# Patient Record
Sex: Female | Born: 1990 | Race: White | Hispanic: No | Marital: Married | Smoking: Never smoker
Health system: Southern US, Community
[De-identification: ages and names within clinical notes are randomized; demographics above are authoritative.]

## PROBLEM LIST (undated history)

## (undated) ENCOUNTER — Inpatient Hospital Stay (HOSPITAL_COMMUNITY): Payer: Self-pay

## (undated) DIAGNOSIS — F329 Major depressive disorder, single episode, unspecified: Secondary | ICD-10-CM

## (undated) DIAGNOSIS — D580 Hereditary spherocytosis: Secondary | ICD-10-CM

## (undated) DIAGNOSIS — F419 Anxiety disorder, unspecified: Secondary | ICD-10-CM

## (undated) DIAGNOSIS — F32A Depression, unspecified: Secondary | ICD-10-CM

## (undated) DIAGNOSIS — M549 Dorsalgia, unspecified: Secondary | ICD-10-CM

## (undated) DIAGNOSIS — Z87442 Personal history of urinary calculi: Secondary | ICD-10-CM

## (undated) HISTORY — PX: CYST EXCISION: SHX5701

## (undated) HISTORY — PX: WISDOM TOOTH EXTRACTION: SHX21

---
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
## 2018-05-21 ENCOUNTER — Emergency Department (HOSPITAL_BASED_OUTPATIENT_CLINIC_OR_DEPARTMENT_OTHER)

## 2018-05-21 ENCOUNTER — Encounter (HOSPITAL_BASED_OUTPATIENT_CLINIC_OR_DEPARTMENT_OTHER): Payer: Self-pay

## 2018-05-21 ENCOUNTER — Other Ambulatory Visit: Payer: Self-pay

## 2018-05-21 ENCOUNTER — Emergency Department (HOSPITAL_BASED_OUTPATIENT_CLINIC_OR_DEPARTMENT_OTHER)
Admission: EM | Admit: 2018-05-21 | Discharge: 2018-05-21 | Disposition: A | Discharge status: Home / Self Care | Attending: Emergency Medicine | Admitting: Emergency Medicine

## 2018-05-21 DIAGNOSIS — N200 Calculus of kidney: Secondary | ICD-10-CM | POA: Diagnosis not present

## 2018-05-21 DIAGNOSIS — R109 Unspecified abdominal pain: Secondary | ICD-10-CM

## 2018-05-21 DIAGNOSIS — R1012 Left upper quadrant pain: Secondary | ICD-10-CM | POA: Diagnosis present

## 2018-05-21 HISTORY — DX: Hereditary spherocytosis: D58.0

## 2018-05-21 HISTORY — DX: Dorsalgia, unspecified: M54.9

## 2018-05-21 LAB — URINALYSIS, ROUTINE W REFLEX MICROSCOPIC
Bilirubin Urine: NEGATIVE
Glucose, UA: NEGATIVE mg/dL
Ketones, ur: 15 mg/dL — AB
Nitrite: NEGATIVE
PROTEIN: 100 mg/dL — AB
pH: 6.5 (ref 5.0–8.0)

## 2018-05-21 LAB — COMPREHENSIVE METABOLIC PANEL
ALK PHOS: 97 U/L (ref 38–126)
ALT: 14 U/L (ref 0–44)
ANION GAP: 8 (ref 5–15)
AST: 19 U/L (ref 15–41)
Albumin: 4.1 g/dL (ref 3.5–5.0)
BILIRUBIN TOTAL: 0.4 mg/dL (ref 0.3–1.2)
BUN: 12 mg/dL (ref 6–20)
CALCIUM: 8.6 mg/dL — AB (ref 8.9–10.3)
CO2: 25 mmol/L (ref 22–32)
Chloride: 108 mmol/L (ref 98–111)
Creatinine, Ser: 0.72 mg/dL (ref 0.44–1.00)
Glucose, Bld: 96 mg/dL (ref 70–99)
POTASSIUM: 3.8 mmol/L (ref 3.5–5.1)
Sodium: 141 mmol/L (ref 135–145)
TOTAL PROTEIN: 7.6 g/dL (ref 6.5–8.1)

## 2018-05-21 LAB — CBC WITH DIFFERENTIAL/PLATELET
BASOS ABS: 0 10*3/uL (ref 0.0–0.1)
Basophils Relative: 0 %
EOS ABS: 0.2 10*3/uL (ref 0.0–0.7)
Eosinophils Relative: 3 %
HCT: 41.5 % (ref 36.0–46.0)
HEMOGLOBIN: 13.8 g/dL (ref 12.0–15.0)
LYMPHS ABS: 1.5 10*3/uL (ref 0.7–4.0)
LYMPHS PCT: 21 %
MCH: 27.6 pg (ref 26.0–34.0)
MCHC: 33.3 g/dL (ref 30.0–36.0)
MCV: 83 fL (ref 78.0–100.0)
Monocytes Absolute: 0.5 10*3/uL (ref 0.1–1.0)
Monocytes Relative: 7 %
NEUTROS PCT: 69 %
Neutro Abs: 5 10*3/uL (ref 1.7–7.7)
Platelets: 257 10*3/uL (ref 150–400)
RBC: 5 MIL/uL (ref 3.87–5.11)
RDW: 14.1 % (ref 11.5–15.5)
WBC: 7.2 10*3/uL (ref 4.0–10.5)

## 2018-05-21 LAB — URINALYSIS, MICROSCOPIC (REFLEX): RBC / HPF: >50 RBC/hpf (ref 0–5)

## 2018-05-21 LAB — LIPASE, BLOOD: LIPASE: 33 U/L (ref 11–51)

## 2018-05-21 LAB — PREGNANCY, URINE: PREG TEST UR: NEGATIVE

## 2018-05-21 MED ORDER — OXYCODONE-ACETAMINOPHEN 5-325 MG PO TABS: 1.0000 | ORAL_TABLET | ORAL | 0 refills | Status: DC | PRN

## 2018-05-21 MED ORDER — SODIUM CHLORIDE 0.9 % IV BOLUS
1000.0000 mL | Freq: Once | INTRAVENOUS | Status: AC
  Administered 2018-05-21: 1000 mL via INTRAVENOUS

## 2018-05-21 NOTE — ED Triage Notes (Addendum)
C/o left lower back/flank pain x 2-3 days-[facility] and dx with kidney stone and constipation-rx cipro, toradol and flomax-states she is having blood in urine and increase pain and was advised to come to ED-NAD-steady gait

## 2018-05-21 NOTE — Discharge Instructions (Signed)
You have been seen in the Emergency Department (ED) today for pain that we believe based on your workup, is caused by kidney stones.  As we have discussed, please drink plenty of fluids.  Please make a follow up appointment with the physician(s) listed elsewhere in this documentation. ° °You may take pain medication as needed but ONLY as prescribed.  Please also take your prescribed Flomax daily.  We also recommend that you take over-the-counter ibuprofen regularly according to label instructions over the next 5 days.  Take it with meals to minimize stomach discomfort. ° °Please see your doctor as soon as possible as stones may take 1-3 weeks to pass and you may require additional care or medications. ° °Do not drink alcohol, drive or participate in any other potentially dangerous activities while taking opiate pain medication as it may make you sleepy. Do not take this medication with any other sedating medications, either prescription or over-the-counter. If you were prescribed Percocet or Vicodin, do not take these with acetaminophen (Tylenol) as it is already contained within these medications. °  °Take Percocet as needed for severe pain.  This medication is an opiate (or narcotic) pain medication and can be habit forming.  Use it as little as possible to achieve adequate pain control.  Do not use or use it with extreme caution if you have a history of opiate abuse or dependence.  If you are on a pain contract with your primary care doctor or a pain specialist, be sure to let them know you were prescribed this medication today from the Emergency Department.  This medication is intended for your use only - do not give any to anyone else and keep it in a secure place where nobody else, especially children, have access to it.  It will also cause or worsen constipation, so you may want to consider taking an over-the-counter stool softener while you are taking this medication. ° °Return to the Emergency Department  (ED) or call your doctor if you have any worsening pain, fever, painful urination, are unable to urinate, or develop other symptoms that concern you. ° ° ° °Kidney Stones °Kidney stones (urolithiasis) are deposits that form inside your kidneys. The intense pain is caused by the stone moving through the urinary tract. When the stone moves, the ureter goes into spasm around the stone. The stone is usually passed in the urine.  °CAUSES  °A disorder that makes certain neck glands produce too much parathyroid hormone (primary hyperparathyroidism). °A buildup of uric acid crystals, similar to gout in your joints. °Narrowing (stricture) of the ureter. °A kidney obstruction present at birth (congenital obstruction). °Previous surgery on the kidney or ureters. °Numerous kidney infections. °SYMPTOMS  °Feeling sick to your stomach (nauseous). °Throwing up (vomiting). °Blood in the urine (hematuria). °Pain that usually spreads (radiates) to the groin. °Frequency or urgency of urination. °DIAGNOSIS  °Taking a history and physical exam. °Blood or urine tests. °CT scan. °Occasionally, an examination of the inside of the urinary bladder (cystoscopy) is performed. °TREATMENT  °Observation. °Increasing your fluid intake. °Extracorporeal shock wave lithotripsy--This is a noninvasive procedure that uses shock waves to break up kidney stones. °Surgery may be needed if you have severe pain or persistent obstruction. There are various surgical procedures. Most of the procedures are performed with the use of small instruments. Only small incisions are needed to accommodate these instruments, so recovery time is minimized. °The size, location, and chemical composition are all important variables that will determine the proper   choice of action for you. Talk to your health care provider to better understand your situation so that you will minimize the risk of injury to yourself and your kidney.  °HOME CARE INSTRUCTIONS  °Drink enough water  and fluids to keep your urine clear or pale yellow. This will help you to pass the stone or stone fragments. °Strain all urine through the provided strainer. Keep all particulate matter and stones for your health care provider to see. The stone causing the pain may be as small as a grain of salt. It is very important to use the strainer each and every time you pass your urine. The collection of your stone will allow your health care provider to analyze it and verify that a stone has actually passed. The stone analysis will often identify what you can do to reduce the incidence of recurrences. °Only take over-the-counter or prescription medicines for pain, discomfort, or fever as directed by your health care provider. °Keep all follow-up visits as told by your health care provider. This is important. °Get follow-up X-rays if required. The absence of pain does not always mean that the stone has passed. It may have only stopped moving. If the urine remains completely obstructed, it can cause loss of kidney function or even complete destruction of the kidney. It is your responsibility to make sure X-rays and follow-ups are completed. Ultrasounds of the kidney can show blockages and the status of the kidney. Ultrasounds are not associated with any radiation and can be performed easily in a matter of minutes. °Make changes to your daily diet as told by your health care provider. You may be told to: °Limit the amount of salt that you eat. °Eat 5 or more servings of fruits and vegetables each day. °Limit the amount of meat, poultry, fish, and eggs that you eat. °Collect a 24-hour urine sample as told by your health care provider. You may need to collect another urine sample every 6-12 months. °SEEK MEDICAL CARE IF: °You experience pain that is progressive and unresponsive to any pain medicine you have been prescribed. °SEEK IMMEDIATE MEDICAL CARE IF:  °Pain cannot be controlled with the prescribed medicine. °You have a  fever or shaking chills. °The severity or intensity of pain increases over 18 hours and is not relieved by pain medicine. °You develop a new onset of abdominal pain. °You feel faint or pass out. °You are unable to urinate. °  °This information is not intended to replace advice given to you by your health care provider. Make sure you discuss any questions you have with your health care provider. °  °Document Released: 08/25/2005 Document Revised: 05/16/2015 Document Reviewed: 01/26/2013 °Elsevier Interactive Patient Education ©2016 [employer] ° ° °

## 2018-05-21 NOTE — ED Provider Notes (Signed)
Emergency Department Provider Note   I have reviewed the triage vital signs and the nursing notes.   HISTORY  Chief Complaint Hematuria   HPI Maria Mercado is a 27 y.o. female resents to the emergency department for evaluation of left flank pain and hematuria.  The patient went to urgent care earlier today where a urinalysis was performed along with abdominal x-ray.  The patient states that she was told she had kidney stones, severe constipation, and possible urine infection.  They called in a prescription for Cipro which the patient has picked up but not taken yet.  They had to call the urgent care back about another prescription that was not at the pharmacy.  She told them at that time she has been having worsening left-sided pain and now more lower abdominal discomfort so was referred to the emergency department.  She denies any fevers or chills.  No vaginal bleeding or discharge.  She has had approximately 5 small, hard bowel movements today after starting MiraLAX.   Past Medical History:  Diagnosis Date  . Back pain   . HS (hereditary spherocytosis) (HCC)     There are no active problems to display for this patient.   Past Surgical History:  Procedure Laterality Date  . WISDOM TOOTH EXTRACTION      Allergies Patient has no known allergies.  No family history on file.  Social History Social History   Tobacco Use  . Smoking status: Never Smoker  . Smokeless tobacco: Never Used  Substance Use Topics  . Alcohol use: Yes    Comment: occ  . Drug use: Never    Review of Systems  Constitutional: No fever/chills Eyes: No visual changes. ENT: No sore throat. Cardiovascular: Denies chest pain. Respiratory: Denies shortness of breath. Gastrointestinal: Positive lower abdominal pain. Positive nausea, no vomiting.  No diarrhea.  Positive constipation. Genitourinary: Positive left flank pain and hematuria.  Musculoskeletal: Negative for back pain. Skin: Negative  for rash. Neurological: Negative for headaches, focal weakness or numbness.  10-point ROS otherwise negative.  ____________________________________________   PHYSICAL EXAM:  VITAL SIGNS: ED Triage Vitals  Enc Vitals Group     BP 05/21/18 1836 139/86     Pulse Rate 05/21/18 1836 67     Resp 05/21/18 1836 18     Temp 05/21/18 1836 98.5 F (36.9 C)     Temp Source 05/21/18 1836 Oral     SpO2 05/21/18 1836 100 %     Weight 05/21/18 1837 177 lb (80.3 kg)     Height 05/21/18 1837 5\' 5"  (1.651 m)     Pain Score 05/21/18 1837 7   Constitutional: Alert and oriented. Well appearing and in no acute distress. Eyes: Conjunctivae are normal.  Head: Atraumatic. Nose: No congestion/rhinnorhea. Mouth/Throat: Mucous membranes are moist. Neck: No stridor. Cardiovascular: Normal rate, regular rhythm. Good peripheral circulation. Grossly normal heart sounds.   Respiratory: Normal respiratory effort.  No retractions. Lungs CTAB. Gastrointestinal: Soft with mild left sided abdominal pain . No distention.  Musculoskeletal: No lower extremity tenderness nor edema. No gross deformities of extremities. Neurologic:  Normal speech and language. No gross focal neurologic deficits are appreciated.  Skin:  Skin is warm, dry and intact. No rash noted.  ____________________________________________   LABS (all labs ordered are listed, but only abnormal results are displayed)  Labs Reviewed  URINALYSIS, ROUTINE W REFLEX MICROSCOPIC - Abnormal; Notable for the following components:      Result Value   Color, Urine BROWN (*)  APPearance CLOUDY (*)    Specific Gravity, Urine >1.030 (*)    Hgb urine dipstick LARGE (*)    Ketones, ur 15 (*)    Protein, ur 100 (*)    Leukocytes, UA TRACE (*)    All other components within normal limits  URINALYSIS, MICROSCOPIC (REFLEX) - Abnormal; Notable for the following components:   Bacteria, UA MANY (*)    All other components within normal limits  COMPREHENSIVE  METABOLIC PANEL - Abnormal; Notable for the following components:   Calcium 8.6 (*)    All other components within normal limits  URINE CULTURE  CBC WITH DIFFERENTIAL/PLATELET  PREGNANCY, URINE  LIPASE, BLOOD   ____________________________________________  RADIOLOGY  Ct Renal Stone Study  Result Date: 05/21/2018 CLINICAL DATA:  Left flank pain EXAM: CT ABDOMEN AND PELVIS WITHOUT CONTRAST TECHNIQUE: Multidetector CT imaging of the abdomen and pelvis was performed following the standard protocol without IV contrast. COMPARISON:  None. FINDINGS: Lower chest: No acute abnormality. Hepatobiliary: No focal liver abnormality is seen. No gallstones, gallbladder wall thickening, or biliary dilatation. Pancreas: Unremarkable. No pancreatic ductal dilatation or surrounding inflammatory changes. Spleen: Normal in size without focal abnormality. Adrenals/Urinary Tract: Adrenal glands are within normal limits. Punctate nonobstructing stone lower pole right kidney. Multiple small stones in the mid left kidney measuring up to 2 mm. Mild left hydronephrosis and hydroureter, secondary to a 2 mm stone in the distal left ureter about a cm proximal to the left UVJ Stomach/Bowel: Stomach is within normal limits. Appendix appears normal. No evidence of bowel wall thickening, distention, or inflammatory changes. Vascular/Lymphatic: No significant vascular findings are present. No enlarged abdominal or pelvic lymph nodes. Reproductive: Uterus and bilateral adnexa are unremarkable. Other: No abdominal wall hernia or abnormality. No abdominopelvic ascites. Musculoskeletal: No acute or significant osseous findings. IMPRESSION: 1. Mild left hydronephrosis and hydroureter, secondary to a 2 mm stone in the distal left ureter about a cm proximal to the left UVJ. 2. Bilateral intrarenal calculi. Electronically Signed   By: Jasmine PangKim  Fujinaga M.D.   On: 05/21/2018 20:24     ____________________________________________   PROCEDURES  Procedure(s) performed:   Procedures  None ____________________________________________   INITIAL IMPRESSION / ASSESSMENT AND PLAN / ED COURSE  Pertinent labs & imaging results that were available during my care of the patient were reviewed by me and considered in my medical decision making (see chart for details).  Presents to the emergency department with left flank pain, hematuria.  Pain is now worsened and seems more lower down to the patient.  She had a plain film at urgent care which showed constipation and according to the patient kidney stones.  Plan for CT renal, labs, UA, and IVF.   CT with small left UJV stone. Patient with Flomax and Toradol prescribed from UC. Advised not taking the Cipro with no clear infection on UA. Patient to continue meds for constipation. Provided stronger pain meds for breakthrough pain but discussed constipation side effects if taken so will try to avoid.   At this time, I do not feel there is any life-threatening condition present. I have reviewed and discussed all results (EKG, imaging, lab, urine as appropriate), exam findings with patient. I have reviewed nursing notes and appropriate previous records.  I feel the patient is safe to be discharged home without further emergent workup. Discussed usual and customary return precautions. Patient and family (if present) verbalize understanding and are comfortable with this plan.  Patient will follow-up with their primary care provider. If  they do not have a primary care provider, information for follow-up has been provided to them. All questions have been answered.  ____________________________________________  FINAL CLINICAL IMPRESSION(S) / ED DIAGNOSES  Final diagnoses:  Kidney stone  Left flank pain     MEDICATIONS GIVEN DURING THIS VISIT:  Medications  sodium chloride 0.9 % bolus 1,000 mL ( Intravenous Stopped 05/21/18 2030)      NEW OUTPATIENT MEDICATIONS STARTED DURING THIS VISIT:  Discharge Medication List as of 05/21/2018  9:00 PM    START taking these medications   Details  oxyCODONE-acetaminophen (PERCOCET/ROXICET) 5-325 MG tablet Take 1 tablet by mouth every 4 (four) hours as needed for severe pain., Starting Fri 05/21/2018, Print        Note:  This document was prepared using Dragon voice recognition software and may include unintentional dictation errors.  Alona Bene, MD Emergency Medicine    Laycee Fitzsimmons, Arlyss Repress, MD 05/22/18 1009

## 2018-05-21 NOTE — ED Notes (Signed)
wtg on u-preg to proceed w/imaging 

## 2018-05-23 LAB — URINE CULTURE: SPECIAL REQUESTS: NORMAL

## 2018-06-01 DIAGNOSIS — L732 Hidradenitis suppurativa: Secondary | ICD-10-CM | POA: Insufficient documentation

## 2018-06-30 ENCOUNTER — Other Ambulatory Visit: Payer: Self-pay

## 2018-06-30 ENCOUNTER — Emergency Department (HOSPITAL_COMMUNITY)
Admission: EM | Admit: 2018-06-30 | Discharge: 2018-07-01 | Disposition: A | Discharge status: Home / Self Care | Attending: Emergency Medicine | Admitting: Emergency Medicine

## 2018-06-30 ENCOUNTER — Encounter (HOSPITAL_COMMUNITY): Payer: Self-pay | Admitting: Emergency Medicine

## 2018-06-30 DIAGNOSIS — S4992XA Unspecified injury of left shoulder and upper arm, initial encounter: Secondary | ICD-10-CM | POA: Diagnosis present

## 2018-06-30 DIAGNOSIS — M7918 Myalgia, other site: Secondary | ICD-10-CM

## 2018-06-30 DIAGNOSIS — Y92410 Unspecified street and highway as the place of occurrence of the external cause: Secondary | ICD-10-CM | POA: Diagnosis not present

## 2018-06-30 DIAGNOSIS — Y999 Unspecified external cause status: Secondary | ICD-10-CM | POA: Diagnosis not present

## 2018-06-30 DIAGNOSIS — Y939 Activity, unspecified: Secondary | ICD-10-CM | POA: Insufficient documentation
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
## 2018-07-01 ENCOUNTER — Emergency Department (HOSPITAL_COMMUNITY)

## 2018-07-01 MED ORDER — IBUPROFEN 600 MG PO TABS: 600.0000 mg | ORAL_TABLET | Freq: Four times a day (QID) | ORAL | 0 refills | Status: DC | PRN

## 2018-07-01 MED ORDER — CYCLOBENZAPRINE HCL 10 MG PO TABS: 10.0000 mg | ORAL_TABLET | Freq: Two times a day (BID) | ORAL | 0 refills | Status: DC | PRN
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
## 2018-08-03 ENCOUNTER — Emergency Department (HOSPITAL_COMMUNITY)

## 2018-08-03 ENCOUNTER — Emergency Department (HOSPITAL_COMMUNITY)
Admission: EM | Admit: 2018-08-03 | Discharge: 2018-08-04 | Disposition: A | Discharge status: Home / Self Care | Attending: Emergency Medicine | Admitting: Emergency Medicine

## 2018-08-03 ENCOUNTER — Encounter: Payer: Self-pay | Admitting: Emergency Medicine

## 2018-08-03 DIAGNOSIS — O219 Vomiting of pregnancy, unspecified: Secondary | ICD-10-CM | POA: Diagnosis not present

## 2018-08-03 DIAGNOSIS — Z79899 Other long term (current) drug therapy: Secondary | ICD-10-CM | POA: Diagnosis not present

## 2018-08-03 DIAGNOSIS — R102 Pelvic and perineal pain: Secondary | ICD-10-CM | POA: Insufficient documentation

## 2018-08-03 DIAGNOSIS — Z3A01 Less than 8 weeks gestation of pregnancy: Secondary | ICD-10-CM | POA: Diagnosis not present

## 2018-08-03 LAB — COMPREHENSIVE METABOLIC PANEL
ALT: 21 U/L (ref 0–44)
AST: 19 U/L (ref 15–41)
Albumin: 3.8 g/dL (ref 3.5–5.0)
Alkaline Phosphatase: 101 U/L (ref 38–126)
Anion gap: 10 (ref 5–15)
BUN: 6 mg/dL (ref 6–20)
CO2: 23 mmol/L (ref 22–32)
Calcium: 9.4 mg/dL (ref 8.9–10.3)
Chloride: 105 mmol/L (ref 98–111)
Creatinine, Ser: 0.67 mg/dL (ref 0.44–1.00)
GFR calc Af Amer: >60 mL/min (ref >60)
GFR calc non Af Amer: >60 mL/min (ref >60)
Glucose, Bld: 88 mg/dL (ref 70–99)
Potassium: 3.8 mmol/L (ref 3.5–5.1)
Sodium: 138 mmol/L (ref 135–145)
Total Bilirubin: 0.8 mg/dL (ref 0.3–1.2)
Total Protein: 7.8 g/dL (ref 6.5–8.1)

## 2018-08-03 LAB — CBC
HCT: 41.6 % (ref 36.0–46.0)
Hemoglobin: 12.9 g/dL (ref 12.0–15.0)
MCH: 26.4 pg (ref 26.0–34.0)
MCHC: 31 g/dL (ref 30.0–36.0)
MCV: 85.1 fL (ref 80.0–100.0)
Platelets: 286 10*3/uL (ref 150–400)
RBC: 4.89 MIL/uL (ref 3.87–5.11)
RDW: 12.8 % (ref 11.5–15.5)
WBC: 10.7 10*3/uL — ABNORMAL HIGH (ref 4.0–10.5)
nRBC: 0 % (ref 0.0–0.2)

## 2018-08-03 LAB — URINALYSIS, ROUTINE W REFLEX MICROSCOPIC
Bacteria, UA: NONE SEEN
Bilirubin Urine: NEGATIVE
Glucose, UA: NEGATIVE mg/dL
Hgb urine dipstick: NEGATIVE
Ketones, ur: 20 mg/dL — AB
Nitrite: NEGATIVE
Protein, ur: 30 mg/dL — AB
Specific Gravity, Urine: 1.028 (ref 1.005–1.030)
pH: 5 (ref 5.0–8.0)

## 2018-08-03 LAB — WET PREP, GENITAL
Clue Cells Wet Prep HPF POC: NONE SEEN
SPERM: NONE SEEN
Trich, Wet Prep: NONE SEEN
Yeast Wet Prep HPF POC: NONE SEEN

## 2018-08-03 LAB — LIPASE, BLOOD: Lipase: 28 U/L (ref 11–51)

## 2018-08-03 LAB — HCG, QUANTITATIVE, PREGNANCY: hCG, Beta Chain, Quant, S: 63662 m[IU]/mL — ABNORMAL HIGH (ref <5)

## 2018-08-03 MED ORDER — ONDANSETRON HCL 4 MG/2ML IJ SOLN
4.0000 mg | Freq: Once | INTRAMUSCULAR | Status: AC
  Administered 2018-08-03: 4 mg via INTRAVENOUS
  Filled 2018-08-03: qty 2

## 2018-08-03 MED ORDER — SODIUM CHLORIDE 0.9 % IV BOLUS
500.0000 mL | Freq: Once | INTRAVENOUS | Status: AC
  Administered 2018-08-03: 500 mL via INTRAVENOUS

## 2018-08-03 NOTE — ED Notes (Signed)
Patient given Ginger Ale and Water. Tolerating PO fluids.

## 2018-08-03 NOTE — ED Triage Notes (Addendum)
Reports having nausea and vomiting that started last night.  Patient is [redacted] weeks pregnant.  Taking phenergan without success.  Has been on that for a week for increased nausea.  Also reports having LL quad cramping intermittently.

## 2018-08-03 NOTE — ED Notes (Signed)
Patient transported to Ultrasound 

## 2018-08-03 NOTE — ED Provider Notes (Signed)
[facility] EMERGENCY DEPARTMENT Provider Note   CSN: [id number] Arrival date & time: 08/03/18  1927     History   Chief Complaint Chief Complaint  Patient presents with  . Emesis During Pregnancy    HPI Maria Mercado is a 27 y.o. female.  HPI   Pt is a 27 y/o female G3P1A1 that is currently [redacted] weeks pregnant by LMP who presents to the ED today c/o nausea and vomiting that began last night. States that she has had nausea for the entire pregnancy but has not vomited until yesterday. States she vomited x2 yesterday and x10. Denies diarrhea, urinary sxs. Denies fevers, chills. She has mild cramping on the left side of the abdomen that began this afternoon. Cramping is there constantly, she rates pain 2-3/10.   States she has been constipated. Denies abnormal vaginal discharge or vaginal bleeding.   LMP 06/12/18. Pt has an appt with [facility] 08/09/18. Has not had [place]. Has been taking prenatal vitamins.  Past Medical History:  Diagnosis Date  . Back pain   . HS (hereditary spherocytosis) (HCC)     There are no active problems to display for this patient.   Past Surgical History:  Procedure Laterality Date  . WISDOM TOOTH EXTRACTION       OB History    Gravida  1   Para      Term      Preterm      AB      Living        SAB      TAB      Ectopic      Multiple      Live Births               Home Medications    Prior to Admission medications   Medication Sig Start Date End Date Taking? Authorizing Provider  FLUoxetine (PROZAC) 20 MG tablet Take 40 mg by mouth daily.   Yes [provider]  Prenatal Vit-Fe Fumarate-FA (PRENATAL MULTIVITAMIN) TABS tablet Take 2 tablets by mouth daily at 12 noon.   Yes [provider]  promethazine (PHENERGAN) 25 MG tablet Take 25 mg by mouth every 6 (six) hours as needed for nausea or vomiting.   Yes [provider]  cyclobenzaprine (FLEXERIL) 10 MG tablet Take 1 tablet  (10 mg total) by mouth 2 (two) times daily as needed for muscle spasms. Patient not taking: Reported on 08/03/2018 07/01/18   Elpidio Anis, PA-C  ibuprofen (ADVIL,MOTRIN) 600 MG tablet Take 1 tablet (600 mg total) by mouth every 6 (six) hours as needed. Patient not taking: Reported on 08/03/2018 07/01/18   Elpidio Anis, PA-C  ondansetron (ZOFRAN) 4 MG tablet Take 1 tablet (4 mg total) by mouth every 8 (eight) hours as needed for nausea or vomiting. 08/04/18   Rosebud Koenen S, PA-C  oxyCODONE-acetaminophen (PERCOCET/ROXICET) 5-325 MG tablet Take 1 tablet by mouth every 4 (four) hours as needed for severe pain. Patient not taking: Reported on 08/03/2018 05/21/18   Long, Arlyss Repress, MD    Family History No family history on file.  Social History Social History   Tobacco Use  . Smoking status: Never Smoker  . Smokeless tobacco: Never Used  Substance Use Topics  . Alcohol use: Yes    Comment: occ  . Drug use: Never     Allergies   Patient has no known allergies.   Review of Systems Review of Systems  Constitutional: Negative for fever.  HENT: Negative for ear pain and sore throat.   Eyes: Negative for pain and visual disturbance.  Respiratory: Negative for cough and shortness of breath.   Cardiovascular: Negative for chest pain.  Gastrointestinal: Positive for abdominal pain, constipation, nausea and vomiting. Negative for diarrhea.  Genitourinary: Negative for dysuria and hematuria.  Musculoskeletal: Negative for back pain.  Skin: Negative for color change and rash.  Neurological: Negative for headaches.  All other systems reviewed and are negative.    Physical Exam Updated Vital Signs BP 105/69   Pulse 89   Temp 98.3 F (36.8 C) (Oral)   Resp 16   Ht 5\' 5"  (1.651 m)   Wt 80.7 kg   LMP 06/12/2018   SpO2 99%   BMI 29.62 kg/m   Physical Exam  Constitutional: She appears well-developed and well-nourished. No distress.  HENT:  Head: Normocephalic and  atraumatic.  Eyes: Conjunctivae are normal.  Neck: Neck supple.  Cardiovascular: Normal rate and regular rhythm.  No murmur heard. Pulmonary/Chest: Effort normal and breath sounds normal. No respiratory distress.  Abdominal: Soft. Bowel sounds are normal.  Very mild RLQ TTP, no rebound, guarding or rigidity.  Genitourinary:  Genitourinary Comments: Exam performed by Karrie Meres,  exam chaperoned Date: 08/04/2018 Pelvic exam: normal external genitalia without evidence of trauma. VULVA: normal appearing vulva with no masses, tenderness or lesion. VAGINA: normal appearing vagina with normal color and discharge, no lesions. CERVIX: normal appearing cervix without lesions, cervical motion tenderness absent, cervical os closed with out purulent discharge; vaginal discharge noted (white) and DNA probe for chlamydia and GC obtained.   ADNEXA: normal adnexa in size, nontender and no masses UTERUS: mild uterine tenderness  Musculoskeletal: She exhibits no edema.  Neurological: She is alert.  Skin: Skin is warm and dry.  Psychiatric: She has a normal mood and affect.  Nursing note and vitals reviewed.   ED Treatments / Results  Labs (all labs ordered are listed, but only abnormal results are displayed) Labs Reviewed  WET PREP, GENITAL - Abnormal; Notable for the following components:      Result Value   WBC, Wet Prep HPF POC MANY (*)    All other components within normal limits  CBC - Abnormal; Notable for the following components:   WBC 10.7 (*)    All other components within normal limits  URINALYSIS, ROUTINE W REFLEX MICROSCOPIC - Abnormal; Notable for the following components:   Color, Urine AMBER (*)    APPearance CLOUDY (*)    Ketones, ur 20 (*)    Protein, ur 30 (*)    Leukocytes, UA SMALL (*)    All other components within normal limits  HCG, QUANTITATIVE, PREGNANCY - Abnormal; Notable for the following components:   hCG, Beta Chain, Quant, [place] 40,981 (*)    All other  components within normal limits  URINE CULTURE  LIPASE, BLOOD  COMPREHENSIVE METABOLIC PANEL  GC/CHLAMYDIA PROBE AMP ([place]) NOT AT [facility]    EKG None  Radiology No results found.  Procedures Procedures (including critical care time)  Medications Ordered in ED Medications  sodium chloride 0.9 % bolus 500 mL (0 mLs Intravenous Stopped 08/03/18 2330)  ondansetron (ZOFRAN) injection 4 mg (4 mg Intravenous Given 08/03/18 2257)     Initial Impression / Assessment and Plan / ED Course  I have reviewed the triage vital signs and the nursing notes.  Pertinent labs & imaging results that were available during my care of the patient were reviewed by me and  considered in my medical decision making (see chart for details).     Final Clinical Impressions(s) / ED Diagnoses   Final diagnoses:  Nausea and vomiting in pregnancy   Patient presenting with nausea, vomiting and abdominal cramping in setting of pregnancy.  States she is [redacted] weeks pregnant by last menstrual cycle.  She has not had an ultrasound to confirm this though she does have OB/GYN follow-up scheduled next week.  Has been taking Phenergan at home with no significant relief.  On exam patient has some mild abdominal tenderness to the right lower quadrant without rebound, rigidity or guarding.  No grimacing on exam.  Pelvic exam with mild uterine tenderness, no adnexal tenderness.  There is some white discharge in the vaginal vault.  No cervical motion tenderness.  Patient has a mild leukocytosis at 10.7, CMP is within normal limits.  Lipase is negative.  Beta hCG positive at 63,000.  UA with ketonuria, proteinuria and small amount of leukocytes.  No significant red blood cells, white blood cells or bacteria.  There are 21-50 squamous epithelial cells, therefore suspect that the specimen is contaminated.  Not suspicious for urinary tract infection and patient has no symptoms.  Will send for culture.  Wet prep with white blood  cells, no trichomoniasis or yeast.  No clue cells.  GC chlamydia obtained.  Patient given a dose of Zofran in the ED as well as a small bolus of fluids.  We will also plan for pelvic ultrasound to rule out ectopic.  At time of shift change, patient care was signed out to Sharilyn SitesLisa Sanders, PA-C with plan to follow-up on the results of the ultrasound and make sure the patient can pass p.o. challenge.  If results are reassuring and patient able to tolerate p.o., will plan for discharge with Rx for Zofran and close follow-up with her OB/GYN.  I discussed strict return precautions with her for any new or worsening symptoms or any worsening abdominal pain.  She voices an understanding of the plan reasons to return.  ED Discharge Orders         Ordered    ondansetron (ZOFRAN) 4 MG tablet  Every 8 hours PRN     08/04/18 0023           Karrie MeresCouture, Guss Farruggia S, PA-C 08/04/18 0028    Raeford RazorKohut, Stephen, MD 08/11/18 1709

## 2018-08-04 LAB — GC/CHLAMYDIA PROBE AMP (~~LOC~~) NOT AT ARMC
CHLAMYDIA, DNA PROBE: NEGATIVE
Neisseria Gonorrhea: NEGATIVE

## 2018-08-04 MED ORDER — ONDANSETRON HCL 4 MG PO TABS: 4.0000 mg | ORAL_TABLET | Freq: Three times a day (TID) | ORAL | 0 refills | Status: DC | PRN

## 2018-08-04 NOTE — ED Notes (Signed)
Patient verbalizes understanding of discharge instructions. Opportunity for questioning and answers were provided. Armband removed by staff, pt discharged from ED ambulatory.   

## 2018-08-04 NOTE — ED Provider Notes (Signed)
Results for orders placed or performed during the hospital encounter of 08/03/18  Wet prep, genital  Result Value Ref Range   Yeast Wet Prep HPF POC NONE SEEN NONE SEEN   Trich, Wet Prep NONE SEEN NONE SEEN   Clue Cells Wet Prep HPF POC NONE SEEN NONE SEEN   WBC, Wet Prep HPF POC MANY (A) NONE SEEN   Sperm NONE SEEN   Lipase, blood  Result Value Ref Range   Lipase 28 11 - 51 U/L  Comprehensive metabolic panel  Result Value Ref Range   Sodium 138 135 - 145 mmol/L   Potassium 3.8 3.5 - 5.1 mmol/L   Chloride 105 98 - 111 mmol/L   CO2 23 22 - 32 mmol/L   Glucose, Bld 88 70 - 99 mg/dL   BUN 6 6 - 20 mg/dL   Creatinine, Ser 4.09 0.44 - 1.00 mg/dL   Calcium 9.4 8.9 - 81.1 mg/dL   Total Protein 7.8 6.5 - 8.1 g/dL   Albumin 3.8 3.5 - 5.0 g/dL   AST 19 15 - 41 U/L   ALT 21 0 - 44 U/L   Alkaline Phosphatase 101 38 - 126 U/L   Total Bilirubin 0.8 0.3 - 1.2 mg/dL   GFR calc non Af Amer >60 >60 mL/min   GFR calc Af Amer >60 >60 mL/min   Anion gap 10 5 - 15  CBC  Result Value Ref Range   WBC 10.7 (H) 4.0 - 10.5 K/uL   RBC 4.89 3.87 - 5.11 MIL/uL   Hemoglobin 12.9 12.0 - 15.0 g/dL   HCT 91.4 78.2 - 95.6 %   MCV 85.1 80.0 - 100.0 fL   MCH 26.4 26.0 - 34.0 pg   MCHC 31.0 30.0 - 36.0 g/dL   RDW 21.3 08.6 - 57.8 %   Platelets 286 150 - 400 K/uL   nRBC 0.0 0.0 - 0.2 %  Urinalysis, Routine w reflex microscopic  Result Value Ref Range   Color, Urine AMBER (A) YELLOW   APPearance CLOUDY (A) CLEAR   Specific Gravity, Urine 1.028 1.005 - 1.030   pH 5.0 5.0 - 8.0   Glucose, UA NEGATIVE NEGATIVE mg/dL   Hgb urine dipstick NEGATIVE NEGATIVE   Bilirubin Urine NEGATIVE NEGATIVE   Ketones, ur 20 (A) NEGATIVE mg/dL   Protein, ur 30 (A) NEGATIVE mg/dL   Nitrite NEGATIVE NEGATIVE   Leukocytes, UA SMALL (A) NEGATIVE   RBC / HPF 0-5 0 - 5 RBC/hpf   WBC, UA 0-5 0 - 5 WBC/hpf   Bacteria, UA NONE SEEN NONE SEEN   Squamous Epithelial / LPF 21-50 0 - 5   Mucus PRESENT   hCG, quantitative,  pregnancy  Result Value Ref Range   hCG, Beta Chain, Quant, S 63,662 (H) <5 mIU/mL   Maria Ob Comp < 14 Wks  Result Date: 08/04/2018 CLINICAL DATA:  Pelvic pain and cramping. Gestational age by LMP of 7 weeks 3 days. EXAM: OBSTETRIC <14 WK Maria AND TRANSVAGINAL OB Maria TECHNIQUE: Both transabdominal and transvaginal ultrasound examinations were performed for complete evaluation of the gestation as well as the maternal uterus, adnexal regions, and pelvic cul-de-sac. Transvaginal technique was performed to assess early pregnancy. COMPARISON:  None. FINDINGS: Intrauterine gestational sac: Single Yolk sac:  Visualized. Embryo:  Visualized. Cardiac Activity: Visualized. Heart Rate: 131 bpm CRL:  8 mm   6 w   5 d  US EDC: 06/17/2018 Subchorionic hemorrhage:  None visualized. Maternal uterus/adnexae: Normal appearance of both ovaries. No mass or abnormal free fluid identified. IMPRESSION: Single living IUP measuring 6 weeks 5 days, with US EDC of 06/17/2018. No significant maternal uterine or adnexal abnormality identified. Electronically Signed   By: Myles RosenthalJohn  Mercado M.D.   On: 08/04/2018 00:31     1:19 AM Tolerating PO without issue.  US without acute findings.  D/c home with instructions and prescribed as outlined by PA Couture.   Garlon HatchetSanders, Harolyn Cocker M, PA-C 08/04/18 [id number]    Dione BoozeGlick, David, MD 08/04/18 [id number]

## 2018-08-04 NOTE — Discharge Instructions (Signed)
Please take zofran as directed on discharge paperwork.  Please follow up with your primary doctor/ob-gyn within the next 5-7 days.  If you do not have a primary care provider, information for a healthcare clinic has been provided for you to make arrangements for follow up care. Please return to the ER sooner if you have any new or worsening symptoms, or if you have any of the following symptoms:  Abdominal pain that does not go away.  You have a fever.  You keep throwing up (vomiting).  The pain is felt only in portions of the abdomen. Pain in the right side could possibly be appendicitis. In an adult, pain in the left lower portion of the abdomen could be colitis or diverticulitis.  You pass bloody or black tarry stools.  There is bright red blood in the stool.  The constipation stays for more than 4 days.  There is belly (abdominal) or rectal pain.  You do not seem to be getting better.  You have any questions or concerns.

## 2018-08-05 LAB — URINE CULTURE

## 2018-08-16 ENCOUNTER — Encounter (HOSPITAL_COMMUNITY): Payer: Self-pay

## 2018-08-16 ENCOUNTER — Inpatient Hospital Stay (HOSPITAL_COMMUNITY): Payer: No Typology Code available for payment source

## 2018-08-16 ENCOUNTER — Inpatient Hospital Stay (HOSPITAL_COMMUNITY)
Admission: AD | Admit: 2018-08-16 | Discharge: 2018-08-16 | Disposition: A | Discharge status: Home / Self Care | Payer: No Typology Code available for payment source | Source: Ambulatory Visit | Attending: Obstetrics and Gynecology | Admitting: Obstetrics and Gynecology

## 2018-08-16 DIAGNOSIS — O26831 Pregnancy related renal disease, first trimester: Secondary | ICD-10-CM | POA: Diagnosis not present

## 2018-08-16 DIAGNOSIS — Z3A09 9 weeks gestation of pregnancy: Secondary | ICD-10-CM | POA: Insufficient documentation

## 2018-08-16 DIAGNOSIS — N132 Hydronephrosis with renal and ureteral calculous obstruction: Secondary | ICD-10-CM | POA: Insufficient documentation

## 2018-08-16 DIAGNOSIS — R109 Unspecified abdominal pain: Secondary | ICD-10-CM | POA: Diagnosis not present

## 2018-08-16 DIAGNOSIS — Z87442 Personal history of urinary calculi: Secondary | ICD-10-CM

## 2018-08-16 DIAGNOSIS — O26891 Other specified pregnancy related conditions, first trimester: Secondary | ICD-10-CM | POA: Diagnosis not present

## 2018-08-16 HISTORY — DX: Personal history of urinary calculi: Z87.442

## 2018-08-16 LAB — URINALYSIS, ROUTINE W REFLEX MICROSCOPIC
Bilirubin Urine: NEGATIVE
Glucose, UA: NEGATIVE mg/dL
KETONES UR: 80 mg/dL — AB
Nitrite: NEGATIVE
PROTEIN: 100 mg/dL — AB
Specific Gravity, Urine: 1.026 (ref 1.005–1.030)
Squamous Epithelial / LPF: >50 — ABNORMAL HIGH (ref 0–5)
pH: 5 (ref 5.0–8.0)

## 2018-08-16 LAB — COMPREHENSIVE METABOLIC PANEL
ALBUMIN: 4.2 g/dL (ref 3.5–5.0)
ALK PHOS: 112 U/L (ref 38–126)
ALT: 42 U/L (ref 0–44)
AST: 28 U/L (ref 15–41)
Anion gap: 12 (ref 5–15)
BUN: 11 mg/dL (ref 6–20)
CALCIUM: 9.5 mg/dL (ref 8.9–10.3)
CHLORIDE: 104 mmol/L (ref 98–111)
CO2: 23 mmol/L (ref 22–32)
CREATININE: 0.7 mg/dL (ref 0.44–1.00)
GFR calc Af Amer: >60 mL/min (ref >60)
GFR calc non Af Amer: >60 mL/min (ref >60)
GLUCOSE: 141 mg/dL — AB (ref 70–99)
Potassium: 4.3 mmol/L (ref 3.5–5.1)
SODIUM: 139 mmol/L (ref 135–145)
Total Bilirubin: 0.8 mg/dL (ref 0.3–1.2)
Total Protein: 8.1 g/dL (ref 6.5–8.1)

## 2018-08-16 LAB — CBC
HCT: 42.9 % (ref 36.0–46.0)
HEMOGLOBIN: 14 g/dL (ref 12.0–15.0)
MCH: 28.1 pg (ref 26.0–34.0)
MCHC: 32.6 g/dL (ref 30.0–36.0)
MCV: 86 fL (ref 80.0–100.0)
NRBC: 0 % (ref 0.0–0.2)
PLATELETS: 246 10*3/uL (ref 150–400)
RBC: 4.99 MIL/uL (ref 3.87–5.11)
RDW: 13.4 % (ref 11.5–15.5)
WBC: 10.5 10*3/uL (ref 4.0–10.5)

## 2018-08-16 MED ORDER — HYDROMORPHONE HCL 1 MG/ML IJ SOLN
1.0000 mg | Freq: Once | INTRAMUSCULAR | Status: AC
  Administered 2018-08-16: 1 mg via INTRAVENOUS
  Filled 2018-08-16: qty 1

## 2018-08-16 MED ORDER — TAMSULOSIN HCL 0.4 MG PO CAPS: 0.4000 mg | ORAL_CAPSULE | Freq: Every day | ORAL | 0 refills | Status: DC

## 2018-08-16 MED ORDER — LACTATED RINGERS IV BOLUS: 1000.0000 mL | Freq: Once | INTRAVENOUS | Status: DC

## 2018-08-16 MED ORDER — DEXTROSE 5 % IN LACTATED RINGERS IV BOLUS
1000.0000 mL | Freq: Once | INTRAVENOUS | Status: AC
  Administered 2018-08-16: 1000 mL via INTRAVENOUS

## 2018-08-16 MED ORDER — PROMETHAZINE HCL 25 MG/ML IJ SOLN
25.0000 mg | Freq: Once | INTRAMUSCULAR | Status: AC
  Administered 2018-08-16: 25 mg via INTRAVENOUS
  Filled 2018-08-16: qty 1

## 2018-08-16 MED ORDER — METOCLOPRAMIDE HCL 5 MG/ML IJ SOLN: 10.0000 mg | Freq: Once | INTRAMUSCULAR | Status: DC

## 2018-08-16 MED ORDER — OXYCODONE HCL 5 MG PO CAPS: 5.0000 mg | ORAL_CAPSULE | Freq: Four times a day (QID) | ORAL | 0 refills | Status: DC | PRN

## 2018-08-16 NOTE — MAU Provider Note (Addendum)
Chief Complaint: Flank Pain and Nausea   First Provider Initiated Contact with Patient 08/16/18 0721     SUBJECTIVE HPI: Maria Mercado is a 27 y.o. G3P1011 at [redacted]w[redacted]d who presents to Maternity Admissions reporting flank pain & n/v. Diagnosed with a kidney stone in September but didn't f/u with urology b/c she couldn't get a referral.  Current symptoms started last night. Reports right flank pain that radiates to her RLQ. Feels like she hasn't been able to fully empty her bladder since yesterday evening. Also reports nausea & vomiting. Denies fever/chills, dysuria, hematuria, or vaginal bleeding.   Location: right flank Quality: throbbing Severity: 7/10 on pain scale Duration: 1 day Timing: constant Modifying factors: nothing makes better or worse Associated signs and symptoms: n/v & urgency  Past Medical History:  Diagnosis Date  . Back pain   . History of kidney stones   . HS (hereditary spherocytosis) (HCC)    OB History  Gravida Para Term Preterm AB Living  3 1 1   1 1   SAB TAB Ectopic Multiple Live Births  1       1    # Outcome Date GA Lbr Len/2nd Weight Sex Delivery Anes PTL Lv  3 Current           2 SAB 2018          1 Term 2012     Vag-Spont   LIV   Past Surgical History:  Procedure Laterality Date  . CYST EXCISION     breast and axial   . WISDOM TOOTH EXTRACTION     Social History   Socioeconomic History  . Marital status: Married    Spouse name: Not on file  . Number of children: Not on file  . Years of education: Not on file  . Highest education level: Not on file  Occupational History  . Not on file  Social Needs  . Financial resource strain: Not on file  . Food insecurity:    Worry: Not on file    Inability: Not on file  . Transportation needs:    Medical: Not on file    Non-medical: Not on file  Tobacco Use  . Smoking status: Never Smoker  . Smokeless tobacco: Never Used  Substance and Sexual Activity  . Alcohol use: Yes    Comment: occ  .  Drug use: Never  . Sexual activity: Not on file  Lifestyle  . Physical activity:    Days per week: Not on file    Minutes per session: Not on file  . Stress: Not on file  Relationships  . Social connections:    Talks on phone: Not on file    Gets together: Not on file    Attends religious service: Not on file    Active member of club or organization: Not on file    Attends meetings of clubs or organizations: Not on file    Relationship status: Not on file  . Intimate partner violence:    Fear of current or ex partner: Not on file    Emotionally abused: Not on file    Physically abused: Not on file    Forced sexual activity: Not on file  Other Topics Concern  . Not on file  Social History Narrative  . Not on file   No family history on file. No current facility-administered medications on file prior to encounter.    Current Outpatient Medications on File Prior to Encounter  Medication Sig Dispense Refill  .  FLUoxetine (PROZAC) 20 MG tablet Take 40 mg by mouth daily.    . Prenatal Vit-Fe Fumarate-FA (PRENATAL MULTIVITAMIN) TABS tablet Take 2 tablets by mouth daily at 12 noon.    . cyclobenzaprine (FLEXERIL) 10 MG tablet Take 1 tablet (10 mg total) by mouth 2 (two) times daily as needed for muscle spasms. (Patient not taking: Reported on 08/03/2018) 20 tablet 0  . ibuprofen (ADVIL,MOTRIN) 600 MG tablet Take 1 tablet (600 mg total) by mouth every 6 (six) hours as needed. (Patient not taking: Reported on 08/03/2018) 30 tablet 0  . ondansetron (ZOFRAN) 4 MG tablet Take 1 tablet (4 mg total) by mouth every 8 (eight) hours as needed for nausea or vomiting. 8 tablet 0  . oxyCODONE-acetaminophen (PERCOCET/ROXICET) 5-325 MG tablet Take 1 tablet by mouth every 4 (four) hours as needed for severe pain. (Patient not taking: Reported on 08/03/2018) 12 tablet 0  . promethazine (PHENERGAN) 25 MG tablet Take 25 mg by mouth every 6 (six) hours as needed for nausea or vomiting.     No Known  Allergies  I have reviewed patient's Past Medical Hx, Surgical Hx, Family Hx, Social Hx, medications and allergies.   Review of Systems  Constitutional: Negative.   Gastrointestinal: Positive for abdominal pain, nausea and vomiting. Negative for constipation and diarrhea.  Genitourinary: Positive for flank pain and urgency. Negative for dysuria, frequency, hematuria, vaginal bleeding and vaginal discharge.    OBJECTIVE Patient Vitals for the past 24 hrs:  BP Temp Temp src Pulse Resp Height Weight  08/16/18 0640 126/80 97.9 F (36.6 C) Oral 77 18 5\' 5"  (1.651 m) 78 kg   Constitutional: Well-developed, well-nourished female. Pt writhing in bed, unable to sit still, crying Cardiovascular: normal rate & rhythm, no murmur Respiratory: normal rate and effort. Lung sounds clear throughout GI: Abd soft, non-tender, Pos BS x 4. No guarding or rebound tenderness. Right CVAT MS: Extremities nontender, no edema, normal ROM Neurologic: Alert and oriented x 4.    LAB RESULTS Results for orders placed or performed during the hospital encounter of 08/16/18 (from the past 24 hour(s))  Urinalysis, Routine w reflex microscopic     Status: Abnormal   Collection Time: 08/16/18  7:03 AM  Result Value Ref Range   Color, Urine AMBER (A) YELLOW   APPearance CLOUDY (A) CLEAR   Specific Gravity, Urine 1.026 1.005 - 1.030   pH 5.0 5.0 - 8.0   Glucose, UA NEGATIVE NEGATIVE mg/dL   Hgb urine dipstick LARGE (A) NEGATIVE   Bilirubin Urine NEGATIVE NEGATIVE   Ketones, ur 80 (A) NEGATIVE mg/dL   Protein, ur 161100 (A) NEGATIVE mg/dL   Nitrite NEGATIVE NEGATIVE   Leukocytes, UA SMALL (A) NEGATIVE   RBC / HPF 21-50 0 - 5 RBC/hpf   WBC, UA 21-50 0 - 5 WBC/hpf   Bacteria, UA RARE (A) NONE SEEN   Squamous Epithelial / LPF >50 (H) 0 - 5   Mucus PRESENT    Non Squamous Epithelial 0-5 (A) NONE SEEN  CBC     Status: None   Collection Time: 08/16/18  8:09 AM  Result Value Ref Range   WBC 10.5 4.0 - 10.5 K/uL    RBC 4.99 3.87 - 5.11 MIL/uL   Hemoglobin 14.0 12.0 - 15.0 g/dL   HCT 09.642.9 04.536.0 - 40.946.0 %   MCV 86.0 80.0 - 100.0 fL   MCH 28.1 26.0 - 34.0 pg   MCHC 32.6 30.0 - 36.0 g/dL   RDW 81.113.4 91.411.5 - 78.215.5 %  Platelets 246 150 - 400 K/uL   nRBC 0.0 0.0 - 0.2 %  Comprehensive metabolic panel     Status: Abnormal   Collection Time: 08/16/18  8:09 AM  Result Value Ref Range   Sodium 139 135 - 145 mmol/L   Potassium 4.3 3.5 - 5.1 mmol/L   Chloride 104 98 - 111 mmol/L   CO2 23 22 - 32 mmol/L   Glucose, Bld 141 (H) 70 - 99 mg/dL   BUN 11 6 - 20 mg/dL   Creatinine, Ser 1.61 0.44 - 1.00 mg/dL   Calcium 9.5 8.9 - 09.6 mg/dL   Total Protein 8.1 6.5 - 8.1 g/dL   Albumin 4.2 3.5 - 5.0 g/dL   AST 28 15 - 41 U/L   ALT 42 0 - 44 U/L   Alkaline Phosphatase 112 38 - 126 U/L   Total Bilirubin 0.8 0.3 - 1.2 mg/dL   GFR calc non Af Amer >60 >60 mL/min   GFR calc Af Amer >60 >60 mL/min   Anion gap 12 5 - 15    IMAGING US Renal  Result Date: 08/16/2018 CLINICAL DATA:  Right flank pain.  History of kidney stones EXAM: RENAL / URINARY TRACT ULTRASOUND COMPLETE COMPARISON:  CT 05/21/2018 FINDINGS: Right Kidney: Renal measurements: 11.2 x 4.9 x 6.0 cm = volume: 190 mL. Mild right hydronephrosis. Visualized proximal right ureter mildly dilated. No mass. Normal echotexture. Left Kidney: Renal measurements: 10.2 x 5.0 x 4.9 cm = volume: 129 mL. Echogenicity within normal limits. No mass or hydronephrosis visualized. Bladder: Decompressed, not well visualized. IMPRESSION: Mild right hydronephrosis. Electronically Signed   By: Charlett Nose M.D.   On: 08/16/2018 08:54    MAU COURSE Orders Placed This Encounter  Procedures  . Culture, OB Urine  . US RENAL  . Urinalysis, Routine w reflex microscopic  . CBC  . Comprehensive metabolic panel  . Bladder scan  . Discharge patient   Meds ordered this encounter  Medications  . FOLLOWED BY Linked Order Group   . lactated ringers bolus 1,000 mL   . dextrose 5% lactated  ringers bolus 1,000 mL  . DISCONTD: metoCLOPramide (REGLAN) injection 10 mg  . HYDROmorphone (DILAUDID) injection 1 mg  . promethazine (PHENERGAN) injection 25 mg  . tamsulosin (FLOMAX) 0.4 MG CAPS capsule    Sig: Take 1 capsule (0.4 mg total) by mouth daily.    Dispense:  30 capsule    Refill:  0    Order Specific Question:   Supervising Provider    Answer:   Jaynie Collins A [[id number]]  . oxycodone (OXY-IR) 5 MG capsule    Sig: Take 1 capsule (5 mg total) by mouth every 6 (six) hours as needed.    Dispense:  10 capsule    Refill:  0    Order Specific Question:   Supervising Provider    Answer:   Jaynie Collins A [[id number]]    MDM Pt very uncomfortable. IV fluids, pain medication, & antiemetic ordered prior to completion of exam.  Renal ultrasound ordered.   Care turned over to [facility] Judeth Horn, NP 08/16/2018  7:21 AM  0930: Pt feeling much better, pain now 2/10. Renal US reviewed, no obstruction, mild hydro on right. Labs reviewed, UA abnormal but contaminated, will send UC. Limited bedside US: viable, active fetus, +cardiac activity, subj. nml AFV Will start Flomax and [facility]. Stable for discharge home.   A/P: 1. [redacted] weeks gestation  of pregnancy   2. Right flank pain   3. History of kidney stones   4. Flank pain    Discharge home Follow up with Urology in 1-2 weeks Rx Flomax Rx Oxycodone IR #10 Phenergan prn (has Rx) Hydrate  Allergies as of 08/16/2018   No Known Allergies     Medication List    STOP taking these medications   cyclobenzaprine 10 MG tablet Commonly known as:  FLEXERIL   ibuprofen 600 MG tablet Commonly known as:  ADVIL,MOTRIN   ondansetron 4 MG tablet Commonly known as:  ZOFRAN   oxyCODONE-acetaminophen 5-325 MG tablet Commonly known as:  PERCOCET/ROXICET     TAKE these medications   FLUoxetine 20 MG tablet Commonly known as:  PROZAC Take 40 mg by mouth daily.   oxycodone 5 MG  capsule Commonly known as:  OXY-IR Take 1 capsule (5 mg total) by mouth every 6 (six) hours as needed.   prenatal multivitamin Tabs tablet Take 2 tablets by mouth daily at 12 noon.   promethazine 25 MG tablet Commonly known as:  PHENERGAN Take 25 mg by mouth every 6 (six) hours as needed for nausea or vomiting.   tamsulosin 0.4 MG Caps capsule Commonly known as:  FLOMAX Take 1 capsule (0.4 mg total) by mouth daily.      Donette Larry, CNM  08/16/2018 9:57 AM

## 2018-08-16 NOTE — Discharge Instructions (Signed)

## 2018-08-16 NOTE — MAU Note (Addendum)
Pt here with c/o pain in right side of abdomen and right flank pain that she thinks may be from kidney stones. Hx of stones. Unable to void fully since last night about 1800. Also having nausea and vomiting.

## 2018-08-17 LAB — CULTURE, OB URINE

## 2018-08-19 LAB — OB RESULTS CONSOLE HIV ANTIBODY (ROUTINE TESTING): HIV: NONREACTIVE

## 2018-08-19 LAB — OB RESULTS CONSOLE ANTIBODY SCREEN: Antibody Screen: NEGATIVE

## 2018-08-19 LAB — OB RESULTS CONSOLE GC/CHLAMYDIA
Chlamydia: NEGATIVE
Gonorrhea: NEGATIVE

## 2018-08-19 LAB — OB RESULTS CONSOLE ABO/RH: RH Type: POSITIVE

## 2018-08-19 LAB — OB RESULTS CONSOLE HEPATITIS B SURFACE ANTIGEN: Hepatitis B Surface Ag: NEGATIVE

## 2018-08-19 LAB — OB RESULTS CONSOLE RPR: RPR: NONREACTIVE

## 2018-08-19 LAB — OB RESULTS CONSOLE RUBELLA ANTIBODY, IGM: Rubella: IMMUNE

## 2018-08-23 ENCOUNTER — Inpatient Hospital Stay (HOSPITAL_COMMUNITY)
Admission: AD | Admit: 2018-08-23 | Discharge: 2018-08-24 | Disposition: A | Discharge status: Home / Self Care | Payer: No Typology Code available for payment source | Attending: Obstetrics and Gynecology | Admitting: Obstetrics and Gynecology

## 2018-08-23 ENCOUNTER — Encounter (HOSPITAL_COMMUNITY): Payer: Self-pay

## 2018-08-23 DIAGNOSIS — Z3A09 9 weeks gestation of pregnancy: Secondary | ICD-10-CM | POA: Diagnosis not present

## 2018-08-23 DIAGNOSIS — O210 Mild hyperemesis gravidarum: Secondary | ICD-10-CM | POA: Insufficient documentation

## 2018-08-23 DIAGNOSIS — O26891 Other specified pregnancy related conditions, first trimester: Secondary | ICD-10-CM

## 2018-08-23 DIAGNOSIS — O219 Vomiting of pregnancy, unspecified: Secondary | ICD-10-CM | POA: Diagnosis not present

## 2018-08-23 LAB — URINALYSIS, MICROSCOPIC (REFLEX)

## 2018-08-23 LAB — URINALYSIS, ROUTINE W REFLEX MICROSCOPIC
Glucose, UA: NEGATIVE mg/dL
Hgb urine dipstick: NEGATIVE
Ketones, ur: >80 mg/dL — AB
Nitrite: NEGATIVE
Protein, ur: 30 mg/dL — AB
Specific Gravity, Urine: >1.03 — ABNORMAL HIGH (ref 1.005–1.030)
pH: 6 (ref 5.0–8.0)

## 2018-08-23 MED ORDER — FAMOTIDINE IN NACL 20-0.9 MG/50ML-% IV SOLN
20.0000 mg | Freq: Once | INTRAVENOUS | Status: AC
  Administered 2018-08-23: 20 mg via INTRAVENOUS
  Filled 2018-08-23: qty 50

## 2018-08-23 MED ORDER — M.V.I. ADULT IV INJ
Freq: Once | INTRAVENOUS | Status: AC
  Administered 2018-08-23: 23:00:00 via INTRAVENOUS
  Filled 2018-08-23: qty 1000

## 2018-08-23 MED ORDER — SODIUM CHLORIDE 0.9 % IV SOLN
8.0000 mg | Freq: Once | INTRAVENOUS | Status: AC
  Administered 2018-08-23: 8 mg via INTRAVENOUS
  Filled 2018-08-23: qty 4

## 2018-08-23 MED ORDER — LACTATED RINGERS IV BOLUS
1000.0000 mL | Freq: Once | INTRAVENOUS | Status: AC
  Administered 2018-08-23: 1000 mL via INTRAVENOUS

## 2018-08-23 NOTE — MAU Note (Signed)
Pt here with c/o nausea and vomiting; hasn't been able to keep anything down for 3 days.

## 2018-08-23 NOTE — MAU Provider Note (Addendum)
History     CSN: [id number]  Arrival date and time: 08/23/18 2027   First Provider Initiated Contact with Patient 08/23/18 2130      Chief Complaint  Patient presents with  . Nausea  . Emesis   HPI  Ms.  Maria Mercado is a 27 y.o. year old G74P1011 female at [redacted]w[redacted]d weeks gestation who presents to MAU reporting N/V and unable to keep anything down for 3 days. She has not taken anything for the N/V.   Past Medical History:  Diagnosis Date  . Back pain   . History of kidney stones   . HS (hereditary spherocytosis) (HCC)     Past Surgical History:  Procedure Laterality Date  . CYST EXCISION     breast and axial   . WISDOM TOOTH EXTRACTION      No family history on file.  Social History   Tobacco Use  . Smoking status: Never Smoker  . Smokeless tobacco: Never Used  Substance Use Topics  . Alcohol use: Yes    Comment: occ  . Drug use: Never    Allergies: No Known Allergies  Medications Prior to Admission  Medication Sig Dispense Refill Last Dose  . doxylamine, Sleep, (UNISOM) 25 MG tablet Take 25 mg by mouth at bedtime as needed.     Marland Kitchen FLUoxetine (PROZAC) 20 MG tablet Take 40 mg by mouth daily.   08/23/2018 at Unknown time  . Prenatal Vit-Fe Fumarate-FA (PRENATAL MULTIVITAMIN) TABS tablet Take 2 tablets by mouth daily at 12 noon.   08/23/2018 at Unknown time  . promethazine (PHENERGAN) 25 MG tablet Take 25 mg by mouth every 6 (six) hours as needed for nausea or vomiting.   Past Week at Unknown time  . pyridOXINE (VITAMIN B-6) 50 MG tablet Take 25 mg by mouth 3 (three) times daily with meals.   08/23/2018 at Unknown time  . oxycodone (OXY-IR) 5 MG capsule Take 1 capsule (5 mg total) by mouth every 6 (six) hours as needed. 10 capsule 0 Unknown at Unknown time  . tamsulosin (FLOMAX) 0.4 MG CAPS capsule Take 1 capsule (0.4 mg total) by mouth daily. 30 capsule 0 Unknown at Unknown time    Review of Systems  Constitutional: Positive for appetite change.  HENT:  Negative.   Eyes: Negative.   Respiratory: Negative.   Cardiovascular: Negative.   Gastrointestinal: Positive for abdominal pain, nausea and vomiting.  Endocrine: Negative.   Genitourinary: Negative.   Musculoskeletal: Negative.   Skin: Negative.   Allergic/Immunologic: Negative.   Neurological: Negative.   Hematological: Negative.   Psychiatric/Behavioral: Negative.    Physical Exam   Blood pressure 127/87, pulse 94, temperature 98 F (36.7 C), temperature source Oral, resp. rate 18, height 5\' 5"  (1.651 m), weight 74.8 kg, last menstrual period 06/12/2018, SpO2 100 %.  Physical Exam  Nursing note and vitals reviewed. Constitutional: She is oriented to person, place, and time. She appears well-developed and well-nourished.  HENT:  Head: Normocephalic and atraumatic.  Eyes: Pupils are equal, round, and reactive to light.  Neck: Normal range of motion.  Cardiovascular: Normal rate.  Respiratory: Effort normal.  GI: Soft.  Genitourinary:    Genitourinary Comments: Pelvic deferred   Musculoskeletal: Normal range of motion.  Neurological: She is alert and oriented to person, place, and time.  Skin: Skin is warm and dry.  Psychiatric: She has a normal mood and affect. Her behavior is normal. Judgment and thought content normal.    MAU Course  Procedures  MDM  CCUA IVFs: LR 1000 ml @ 999 ml/hr, Zofran 8 mg IVPB, Pepcid 20 mg IVPB; then MVI in LR 1000 ml @ 500 ml/hr   Results for orders placed or performed during the hospital encounter of 08/23/18 (from the past 24 hour(s))  Urinalysis, Routine w reflex microscopic     Status: Abnormal   Collection Time: 08/23/18  9:09 PM  Result Value Ref Range   Color, Urine YELLOW YELLOW   APPearance CLEAR CLEAR   Specific Gravity, Urine >1.030 (H) 1.005 - 1.030   pH 6.0 5.0 - 8.0   Glucose, UA NEGATIVE NEGATIVE mg/dL   Hgb urine dipstick NEGATIVE NEGATIVE   Bilirubin Urine MODERATE (A) NEGATIVE   Ketones, ur >80 (A) NEGATIVE mg/dL    Protein, ur 30 (A) NEGATIVE mg/dL   Nitrite NEGATIVE NEGATIVE   Leukocytes, UA TRACE (A) NEGATIVE  Urinalysis, Microscopic (reflex)     Status: Abnormal   Collection Time: 08/23/18  9:09 PM  Result Value Ref Range   RBC / HPF 0-5 0 - 5 RBC/hpf   WBC, UA 6-10 0 - 5 WBC/hpf   Bacteria, UA FEW (A) NONE SEEN   Squamous Epithelial / LPF 6-10 0 - 5   Mucus PRESENT     Report given to Steward DroneVeronica Loria Lacina, CNM @ 2210  Raelyn Moraolitta Dawson, MSN, CNM 08/23/2018, 9:30 PM   Patient able to tolerate crackers and ice chips after treatment. Discussed plan of care with patient with treatment options. Rx for Phenergan sent to pharmacy of choice. Discussed reasons to return to MAU. Follow up as scheduled for prenatal appointments. Pt stable at time of discharge.  Assessment and Plan   1. Nausea and vomiting during pregnancy   2. [redacted] weeks gestation of pregnancy    Discharge home  Follow up as scheduled for prenatal appointments  Return to MAU as needed  Rx for Phenergan   Follow-up Information    [facility].   Why:  Follow up as scheduled for prenatal appointments and return to MAU as needed  Contact information: [address]  [address] [place] [id number] [phone]          Sharyon CableVeronica C Lastacia Solum, CNM 08/24/18, 12:15 AM

## 2018-08-24 MED ORDER — PROMETHAZINE HCL 25 MG PO TABS: 25.0000 mg | ORAL_TABLET | Freq: Four times a day (QID) | ORAL | 1 refills | Status: DC | PRN

## 2018-08-24 NOTE — Discharge Instructions (Signed)

## 2018-09-03 ENCOUNTER — Inpatient Hospital Stay (HOSPITAL_COMMUNITY): Payer: No Typology Code available for payment source

## 2018-09-03 ENCOUNTER — Inpatient Hospital Stay (HOSPITAL_COMMUNITY)
Admission: AD | Admit: 2018-09-03 | Discharge: 2018-09-03 | Disposition: A | Discharge status: Home / Self Care | Payer: No Typology Code available for payment source | Attending: Obstetrics and Gynecology | Admitting: Obstetrics and Gynecology

## 2018-09-03 ENCOUNTER — Encounter (HOSPITAL_COMMUNITY): Payer: Self-pay

## 2018-09-03 DIAGNOSIS — O26831 Pregnancy related renal disease, first trimester: Secondary | ICD-10-CM | POA: Insufficient documentation

## 2018-09-03 DIAGNOSIS — M545 Low back pain, unspecified: Secondary | ICD-10-CM

## 2018-09-03 DIAGNOSIS — O26899 Other specified pregnancy related conditions, unspecified trimester: Secondary | ICD-10-CM

## 2018-09-03 DIAGNOSIS — O211 Hyperemesis gravidarum with metabolic disturbance: Secondary | ICD-10-CM | POA: Diagnosis not present

## 2018-09-03 DIAGNOSIS — E860 Dehydration: Secondary | ICD-10-CM

## 2018-09-03 DIAGNOSIS — O219 Vomiting of pregnancy, unspecified: Secondary | ICD-10-CM

## 2018-09-03 DIAGNOSIS — Z3A11 11 weeks gestation of pregnancy: Secondary | ICD-10-CM | POA: Insufficient documentation

## 2018-09-03 DIAGNOSIS — O26892 Other specified pregnancy related conditions, second trimester: Secondary | ICD-10-CM | POA: Diagnosis not present

## 2018-09-03 DIAGNOSIS — N200 Calculus of kidney: Secondary | ICD-10-CM | POA: Insufficient documentation

## 2018-09-03 DIAGNOSIS — Z87442 Personal history of urinary calculi: Secondary | ICD-10-CM

## 2018-09-03 DIAGNOSIS — O99280 Endocrine, nutritional and metabolic diseases complicating pregnancy, unspecified trimester: Secondary | ICD-10-CM

## 2018-09-03 DIAGNOSIS — O99891 Other specified diseases and conditions complicating pregnancy: Secondary | ICD-10-CM

## 2018-09-03 LAB — CBC
HCT: 39.4 % (ref 36.0–46.0)
Hemoglobin: 13 g/dL (ref 12.0–15.0)
MCH: 28.1 pg (ref 26.0–34.0)
MCHC: 33 g/dL (ref 30.0–36.0)
MCV: 85.1 fL (ref 80.0–100.0)
NRBC: 0 % (ref 0.0–0.2)
PLATELETS: 222 10*3/uL (ref 150–400)
RBC: 4.63 MIL/uL (ref 3.87–5.11)
RDW: 13.9 % (ref 11.5–15.5)
WBC: 8.5 10*3/uL (ref 4.0–10.5)

## 2018-09-03 LAB — URINALYSIS, ROUTINE W REFLEX MICROSCOPIC
BILIRUBIN URINE: NEGATIVE
Glucose, UA: NEGATIVE mg/dL
KETONES UR: 80 mg/dL — AB
Nitrite: NEGATIVE
PH: 6 (ref 5.0–8.0)
Protein, ur: 100 mg/dL — AB
Specific Gravity, Urine: 1.021 (ref 1.005–1.030)

## 2018-09-03 MED ORDER — LACTATED RINGERS IV BOLUS
1000.0000 mL | Freq: Once | INTRAVENOUS | Status: AC
  Administered 2018-09-03: 1000 mL via INTRAVENOUS

## 2018-09-03 MED ORDER — LACTATED RINGERS IV BOLUS: 1000.0000 mL | Freq: Once | INTRAVENOUS | 0 refills | Status: AC

## 2018-09-03 MED ORDER — OXYCODONE HCL 5 MG PO CAPS: 5.0000 mg | ORAL_CAPSULE | Freq: Four times a day (QID) | ORAL | 0 refills | Status: DC | PRN

## 2018-09-03 MED ORDER — PROMETHAZINE HCL 25 MG/ML IJ SOLN
25.0000 mg | Freq: Once | INTRAMUSCULAR | Status: DC
  Filled 2018-09-03: qty 1

## 2018-09-03 MED ORDER — OXYCODONE-ACETAMINOPHEN 5-325 MG PO TABS
2.0000 | ORAL_TABLET | Freq: Once | ORAL | Status: AC
  Administered 2018-09-03: 2 via ORAL
  Filled 2018-09-03 (×2): qty 2

## 2018-09-03 MED ORDER — PROMETHAZINE HCL 25 MG/ML IJ SOLN
25.0000 mg | Freq: Once | INTRAMUSCULAR | Status: AC
  Administered 2018-09-03: 25 mg via INTRAVENOUS

## 2018-09-03 NOTE — MAU Provider Note (Signed)
Chief Complaint: Back Pain   First Provider Initiated Contact with Patient 09/03/18 1644      SUBJECTIVE HPI: Maria DoveCynthia Mercado is a 27 y.o. G3P1011 at 2658w1d by LMP who presents to maternity admissions reporting pain in her left lower back and left flank. She reports the pain is constant dull pain with some sharp shooting pain that radiates from her left lower back around her flank and in to her left lower abdomen.  She has nausea and is on medications for this during pregnancy but there are no changes to her baseline nausea with pregnancy. There is no dysuria. She has no other associated symptoms. She has tried heat and Tylenol which have not removed the pain. She has hx kidney stones, last one 2 weeks ago with significant pain when they pass. She has referral to Urology but bc of her insurance and veteran status, she [place] [facility] her[facility]  Marland Kitchen.    HPI  Past Medical History:  Diagnosis Date  . Back pain   . History of kidney stones   . HS (hereditary spherocytosis) (HCC)    Past Surgical History:  Procedure Laterality Date  . CYST EXCISION     breast and axial   . WISDOM TOOTH EXTRACTION     Social History   Socioeconomic History  . Marital status: Married    Spouse name: Not on file  . Number of children: Not on file  . Years of education: Not on file  . Highest education level: Not on file  Occupational History  . Not on file  Social Needs  . Financial resource strain: Not on file  . Food insecurity:    Worry: Not on file    Inability: Not on file  . Transportation needs:    Medical: Not on file    Non-medical: Not on file  Tobacco Use  . Smoking status: Never Smoker  . Smokeless tobacco: Never Used  Substance and Sexual Activity  . Alcohol use: Not Currently    Comment: occ  . Drug use: Never  . Sexual activity: Not on file  Lifestyle  . Physical activity:    Days per week: Not on file    Minutes per session: Not on file  .  Stress: Not on file  Relationships  . Social connections:    Talks on phone: Not on file    Gets together: Not on file    Attends religious service: Not on file    Active member of club or organization: Not on file    Attends meetings of clubs or organizations: Not on file    Relationship status: Not on file  . Intimate partner violence:    Fear of current or ex partner: Not on file    Emotionally abused: Not on file    Physically abused: Not on file    Forced sexual activity: Not on file  Other Topics Concern  . Not on file  Social History Narrative  . Not on file   No current facility-administered medications on file prior to encounter.    Current Outpatient Medications on File Prior to Encounter  Medication Sig Dispense Refill  . doxylamine, Sleep, (UNISOM) 25 MG tablet Take 25 mg by mouth at bedtime as needed.    Marland Kitchen. FLUoxetine (PROZAC) 20 MG tablet Take 40 mg by mouth daily.    . Prenatal Vit-Fe Fumarate-FA (PRENATAL MULTIVITAMIN) TABS tablet Take 2 tablets by mouth daily at 12  noon.    . promethazine (PHENERGAN) 25 MG tablet Take 1 tablet (25 mg total) by mouth every 6 (six) hours as needed for nausea or vomiting. 30 tablet 1  . pyridOXINE (VITAMIN B-6) 50 MG tablet Take 25 mg by mouth 3 (three) times daily with meals.    . tamsulosin (FLOMAX) 0.4 MG CAPS capsule Take 1 capsule (0.4 mg total) by mouth daily. 30 capsule 0   No Known Allergies  ROS:  Review of Systems  Constitutional: Negative for chills, fatigue and fever.  Respiratory: Negative for shortness of breath.   Cardiovascular: Negative for chest pain.  Gastrointestinal: Positive for abdominal pain.  Genitourinary: Positive for flank pain and pelvic pain. Negative for difficulty urinating, dysuria, vaginal bleeding, vaginal discharge and vaginal pain.  Musculoskeletal: Positive for back pain.  Neurological: Negative for dizziness and headaches.  Psychiatric/Behavioral: Negative.      I have reviewed patient's  Past Medical Hx, Surgical Hx, Family Hx, Social Hx, medications and allergies.   Physical Exam   Patient Vitals for the past 24 hrs:  BP Temp Temp src Pulse Resp SpO2 Height Weight  09/03/18 1517 122/84 98.6 F (37 C) Oral (!) 101 16 99 % 5\' 5"  (1.651 m) 74.8 kg   Constitutional: Well-developed, well-nourished female in no acute distress.  Cardiovascular: normal rate Respiratory: normal effort GI: Abd soft, non-tender. Pos BS x 4 MS: Extremities nontender, no edema, normal ROM Neurologic: Alert and oriented x 4.  GU: Positive mild CVA tenderness on left, none on right  PELVIC EXAM: Deferred  FHT 171 by doppler  LAB RESULTS Results for orders placed or performed during the hospital encounter of 09/03/18 (from the past 24 hour(s))  Urinalysis, Routine w reflex microscopic     Status: Abnormal   Collection Time: 09/03/18  3:47 PM  Result Value Ref Range   Color, Urine AMBER (A) YELLOW   APPearance CLOUDY (A) CLEAR   Specific Gravity, Urine 1.021 1.005 - 1.030   pH 6.0 5.0 - 8.0   Glucose, UA NEGATIVE NEGATIVE mg/dL   Hgb urine dipstick LARGE (A) NEGATIVE   Bilirubin Urine NEGATIVE NEGATIVE   Ketones, ur 80 (A) NEGATIVE mg/dL   Protein, ur 161100 (A) NEGATIVE mg/dL   Nitrite NEGATIVE NEGATIVE   Leukocytes, UA MODERATE (A) NEGATIVE   RBC / HPF >50 (H) 0 - 5 RBC/hpf   WBC, UA 21-50 0 - 5 WBC/hpf   Bacteria, UA MANY (A) NONE SEEN   Squamous Epithelial / LPF 21-50 0 - 5   Mucus PRESENT   CBC     Status: None   Collection Time: 09/03/18  5:04 PM  Result Value Ref Range   WBC 8.5 4.0 - 10.5 K/uL   RBC 4.63 3.87 - 5.11 MIL/uL   Hemoglobin 13.0 12.0 - 15.0 g/dL   HCT 09.639.4 04.536.0 - 40.946.0 %   MCV 85.1 80.0 - 100.0 fL   MCH 28.1 26.0 - 34.0 pg   MCHC 33.0 30.0 - 36.0 g/dL   RDW 81.113.9 91.411.5 - 78.215.5 %   Platelets 222 150 - 400 K/uL   nRBC 0.0 0.0 - 0.2 %       IMAGING [place] Renal  Result Date: 09/03/2018 CLINICAL DATA:  LEFT LOWER back pain.  History of renal calculi. EXAM: RENAL /  URINARY TRACT ULTRASOUND COMPLETE COMPARISON:  08/16/2018, 08/03/2018 FINDINGS: Right Kidney: Renal measurements: 11.2 x 4.6 x 6.3 centimeter = volume: 170 mL . Echogenicity within normal limits. No mass. Interval resolution of  RIGHT-sided hydronephrosis. Left Kidney: Renal measurements: 10.9 x 6.2 x 5.4 centimeter = volume: 193 mL. Echogenicity within normal limits. No mass or hydronephrosis visualized. Bladder: Urinary bladder is decompressed.  Partially imaged gravid uterus. IMPRESSION: 1. No hydronephrosis. 2. No renal mass. 3. Partially imaged gravid uterus. Electronically Signed   By: Norva Pavlov M.D.   On: 09/03/2018 18:01   US Renal  Result Date: 08/16/2018 CLINICAL DATA:  Right flank pain.  History of kidney stones EXAM: RENAL / URINARY TRACT ULTRASOUND COMPLETE COMPARISON:  CT 05/21/2018 FINDINGS: Right Kidney: Renal measurements: 11.2 x 4.9 x 6.0 cm = volume: 190 mL. Mild right hydronephrosis. Visualized proximal right ureter mildly dilated. No mass. Normal echotexture. Left Kidney: Renal measurements: 10.2 x 5.0 x 4.9 cm = volume: 129 mL. Echogenicity within normal limits. No mass or hydronephrosis visualized. Bladder: Decompressed, not well visualized. IMPRESSION: Mild right hydronephrosis. Electronically Signed   By: Charlett Nose M.D.   On: 08/16/2018 08:54    MAU Management/MDM: Ordered labs and renal  US and reviewed results.  UA with large hemoglobin, many bacteria. Urine sent for culture.  Dehydration noted with 80 ketones.  [place] with no abnormalities, no evidence of blockage.  Symptoms c/w stone and pt pain worsening in MAU.  Percocet 5/325 x 2 tabs in MAU.  Pt with nausea prior to medication but worse after medicine so Phenergan given IV with IV LR x 2000 ml.  D/C home with pain management with oxycodone 5 mg Q 4 hours PRN x 10 tabs. F/U with Ob/Gyn office. Return to MAU as needed for emergencies. Pt discharged with strict return precautions.  ASSESSMENT 1. Kidney stone complicating  pregnancy, first trimester   2. Left low back pain   3. Hx of renal calculi   4. Dehydration during pregnancy   5. Nausea and vomiting during pregnancy prior to [redacted] weeks gestation     PLAN Discharge home Allergies as of 09/03/2018   No Known Allergies     Medication List    TAKE these medications   doxylamine (Sleep) 25 MG tablet Commonly known as:  UNISOM Take 25 mg by mouth at bedtime as needed.   FLUoxetine 20 MG tablet Commonly known as:  PROZAC Take 40 mg by mouth daily.   lactated ringers Inject 1,000 mLs into the vein once for 1 dose.   oxycodone 5 MG capsule Commonly known as:  OXY-IR Take 1 capsule (5 mg total) by mouth every 6 (six) hours as needed.   prenatal multivitamin Tabs tablet Take 2 tablets by mouth daily at 12 noon.   promethazine 25 MG tablet Commonly known as:  PHENERGAN Take 1 tablet (25 mg total) by mouth every 6 (six) hours as needed for nausea or vomiting.   pyridOXINE 50 MG tablet Commonly known as:  VITAMIN B-6 Take 25 mg by mouth 3 (three) times daily with meals.   tamsulosin 0.4 MG Caps capsule Commonly known as:  FLOMAX Take 1 capsule (0.4 mg total) by mouth daily.      Follow-up Information    [facility] Follow up.   Why:  As scheduled, return to MAU as needed for emergencies. Contact information: [address]  [address] [place] [address] [phone]           Sharen Counter Certified Nurse-Midwife 09/03/2018  8:19 PM

## 2018-09-03 NOTE — MAU Note (Signed)
Pt reports pain left back and left side since this am. History of kidney stones. Denies vaginal bleeding.

## 2018-09-04 LAB — CULTURE, OB URINE: Culture: >=100000 — AB

## 2018-09-06 ENCOUNTER — Telehealth: Payer: Self-pay | Admitting: Advanced Practice Midwife

## 2018-09-06 NOTE — Telephone Encounter (Signed)
Pt presented to MAU for prescription written on 09/04/18.  Pt has pharmacy that does not allow eprescribing so Rx printed but provider not aware.  Since [place] was in [place] office, [facility] and I signed her oxycodone 5 mg Q 4 hours PRN x 10 tabs order to take for kidney stone pain.  Pt to follow up in her OB/Gyn office.

## 2018-09-08 NOTE — L&D Delivery Note (Signed)
Delivery Note Pt pushed great for aboput 15 minutes and at 9:35 PM a healthy female was delivered via Vaginal, Spontaneous (Presentation: LOA  ).  APGAR: 8, 9; weight pending .   Placenta status: delivered spontaneously, calcifications noted .  Cord:  with the following complications:nuchal x 1 .    Anesthesia: epidural Episiotomy: None Lacerations: 2nd degree Suture Repair: 3.0 vicryl rapide Est. Blood Loss (mL):  242ml  Mom to postpartum.  Baby to Couplet care / Skin to Skin.  D/w parents circumcision and they desire to proceed in the hospital.  Maria Mercado 04/01/2019, 10:14 PM

## 2018-12-09 ENCOUNTER — Other Ambulatory Visit: Payer: Self-pay

## 2018-12-09 ENCOUNTER — Telehealth: Payer: Non-veteran care | Admitting: Physician Assistant

## 2018-12-09 ENCOUNTER — Encounter (HOSPITAL_COMMUNITY): Payer: Self-pay | Admitting: Emergency Medicine

## 2018-12-09 ENCOUNTER — Ambulatory Visit (HOSPITAL_COMMUNITY)
Admission: EM | Admit: 2018-12-09 | Discharge: 2018-12-09 | Disposition: A | Discharge status: Home / Self Care | Attending: Family Medicine | Admitting: Family Medicine

## 2018-12-09 DIAGNOSIS — R059 Cough, unspecified: Secondary | ICD-10-CM

## 2018-12-09 DIAGNOSIS — R42 Dizziness and giddiness: Secondary | ICD-10-CM

## 2018-12-09 DIAGNOSIS — R05 Cough: Secondary | ICD-10-CM | POA: Diagnosis not present

## 2018-12-09 DIAGNOSIS — R55 Syncope and collapse: Secondary | ICD-10-CM

## 2018-12-09 LAB — BASIC METABOLIC PANEL WITH GFR
Anion gap: 8 (ref 5–15)
BUN: <5 mg/dL — ABNORMAL LOW (ref 6–20)
CO2: 21 mmol/L — ABNORMAL LOW (ref 22–32)
Calcium: 8.7 mg/dL — ABNORMAL LOW (ref 8.9–10.3)
Chloride: 107 mmol/L (ref 98–111)
Creatinine, Ser: 0.49 mg/dL (ref 0.44–1.00)
GFR calc Af Amer: >60 mL/min
GFR calc non Af Amer: >60 mL/min
Glucose, Bld: 96 mg/dL (ref 70–99)
Potassium: 3.5 mmol/L (ref 3.5–5.1)
Sodium: 136 mmol/L (ref 135–145)

## 2018-12-09 LAB — POCT URINALYSIS DIP (DEVICE)
Bilirubin Urine: NEGATIVE
Glucose, UA: NEGATIVE mg/dL
Ketones, ur: NEGATIVE mg/dL
Nitrite: NEGATIVE
Protein, ur: NEGATIVE mg/dL
Specific Gravity, Urine: 1.02 (ref 1.005–1.030)
Urobilinogen, UA: 0.2 mg/dL (ref 0.0–1.0)
pH: 7 (ref 5.0–8.0)

## 2018-12-09 LAB — HEMOGLOBIN AND HEMATOCRIT, BLOOD
HCT: 34.2 % — ABNORMAL LOW (ref 36.0–46.0)
Hemoglobin: 10.9 g/dL — ABNORMAL LOW (ref 12.0–15.0)

## 2018-12-09 MED ORDER — CEPHALEXIN 500 MG PO CAPS: 500.0000 mg | ORAL_CAPSULE | Freq: Two times a day (BID) | ORAL | 0 refills | Status: DC

## 2018-12-09 NOTE — ED Triage Notes (Signed)
Called ob-told to do online health assessment-they told her to come here.    On 3/30 started coughing.  When coughing very hard would "see stars"  3/31 cough was minor 12/08/2018 patient was standing, got dizzy and "collapsed".  Patient reports hitting her back in collapse.  No loc.    4/2/2020patient reports cough is worse today and dizziness is worse today.   No abdominal pain, no contractions, no injury to abdomen.    Patient flew to [place] 3 weeks ago, then drove to [place].  Patient flew back approximately 3 weeks ago and traveled through [place] with return flight.  Entire trip last approx 3 days

## 2018-12-09 NOTE — ED Notes (Signed)
Patient verbalizes understanding of discharge instructions. Opportunity for questioning and answers were provided. Patient discharged from [facility] by provider.  

## 2018-12-09 NOTE — Progress Notes (Deleted)
Based on what you shared with me, I feel your condition warrants further evaluation and I recommend that you be seen for a face to face office visit.     NOTE: If you entered your credit card information for this eVisit, you will not be charged. You may see a "hold" on your card for the $35 but that hold will drop off and you will not have a charge processed.  If you are having a true medical emergency please call 911.  If you need an urgent face to face visit, [facility] has four urgent care centers for your convenience.    PLEASE NOTE: THE [facility] LOCATIONS AND URGENT CARE CLINICS DO NOT HAVE THE TESTING FOR CORONAVIRUS COVID19 AVAILABLE.  IF YOU FEEL YOU NEED THIS TEST YOU MUST GO TO A TRIAGE LOCATION AT ONE OF THE HOSPITAL EMERGENCY DEPARTMENTS   [email or web address] to reserve your spot online an avoid wait times  [facility] [address], [address] [place], [place] [address] 8 am to 8 pm Monday-Friday 10 am to 4 pm Saturday-Sunday *Across the street from [employer]  [facility]  [address] [place], [address] 8 am to 5 pm Monday-Friday * In the [facility]   The following sites will take your insurance:  . [facility]  [phone] Get Driving Directions Find a Provider at this Location  [address] [place], [place] [address] . 10 am to 8 pm Monday-Friday . 12 pm to 8 pm Saturday-Sunday   . [facility]  [phone] Get Driving Directions Find a Provider at this Location  [address], [address] [place], [place] [address] . 8 am to 8 pm Monday-Friday . 9 am to 6 pm Saturday . 11 am to 6 pm Sunday   .  [facility]gent [facility] at [facility] [place]  [phone] Get Driving Directions  [address] [place],  [address] . 8 am to 8 pm Monday-Friday . 8 am to 4 pm Saturday-Sunday   Your e-visit answers were  reviewed by a board certified advanced clinical practitioner to complete your personal care plan.  Thank you for using e-Visits. 

## 2018-12-09 NOTE — Progress Notes (Signed)
Based on what you shared with me, I feel your condition warrants further evaluation and I recommend that you be seen for a face to face office visit.     NOTE: If you entered your credit card information for this eVisit, you will not be charged. You may see a "hold" on your card for the $35 but that hold will drop off and you will not have a charge processed.  If you are having a true medical emergency please call 911.  If you need an urgent face to face visit, [facility] has four urgent care centers for your convenience.    PLEASE NOTE: THE [facility] LOCATIONS AND URGENT CARE CLINICS DO NOT HAVE THE TESTING FOR CORONAVIRUS COVID19 AVAILABLE.  IF YOU FEEL YOU NEED THIS TEST YOU MUST GO TO A TRIAGE LOCATION AT ONE OF THE HOSPITAL EMERGENCY DEPARTMENTS   [email or web address] to reserve your spot online an avoid wait times  [facility] [place] [address], [address] [place], [place] [address] 8 am to 8 pm Monday-Friday 10 am to 4 pm Saturday-Sunday *Across the street from [employer]  [facility]  [address]  [place], [address] 8 am to 5 pm Monday-Friday * In the [facility]   The following sites will take your insurance:  . [facility]  [phone] Get Driving Directions Find a Provider at this Location  [address] [place], [place] [address] . 10 am to 8 pm Monday-Friday . 12 pm to 8 pm Saturday-Sunday   . [facility]  [phone] Get Driving Directions Find a Provider at this Location  [address], [address] [place], [place] [address] . 8 am to 8 pm Monday-Friday . 9 am to 6 pm Saturday . 11 am to 6 pm Sunday   . [facility] [place]  [phone] Get Driving Directions  [address] [place], [place] [address] . 8 am to 8 pm Monday-Friday . 8 am to 4 pm Saturday-Sunday   Your e-visit answers were  reviewed by a board certified advanced clinical practitioner to complete your personal care plan.  Thank you for using e-Visits. 

## 2018-12-09 NOTE — Discharge Instructions (Signed)
In addition to sending your urine sample for culture, we have sent blood work that is currently pending. We will inform you of any significant abnormalities.

## 2018-12-10 LAB — URINE CULTURE: Culture: <10000 — AB

## 2018-12-22 NOTE — ED Provider Notes (Addendum)
[facility]   [id number] 12/09/18 Arrival Time: 1351  ASSESSMENT & PLAN:  1. Cough   2. Lightheadedness     Meds ordered this encounter  Medications  . cephALEXin (KEFLEX) 500 MG capsule    Sig: Take 1 capsule (500 mg total) by mouth 2 (two) times daily.    Dispense:  10 capsule    Refill:  0   Will empirically tx for UTI. Culture sent. No signs of pyelonephritis. Viral URI symptoms. Very low suspicion for COVID-19.  Pending: Orders Placed This Encounter  Procedures  . Urine culture    Standing Status:   Standing    Number of Occurrences:   1  . Basic metabolic panel    Standing Status:   Standing    Number of Occurrences:   1  . Hemoglobin and hematocrit, blood    Standing Status:   Standing    Number of Occurrences:   1   The patient is reassured that these symptoms do not appear to represent a serious or threatening condition. Recommend calling OB to inform and f/u as recommended.  Reviewed expectations re: course of current medical issues. Questions answered. Outlined signs and symptoms indicating need for more acute intervention. Patient verbalized understanding. After Visit Summary given.   SUBJECTIVE:  Maria Mercado is a 28 y.o. female who presents for evaluation of occasional lightheadedness. Spoke with OB provider on phone who recommended coming here for evaluation. Since 3/30 with mild cough that has worsened. Dry and non-productive. No associated SOB/wheezing. On 4/1 reported feeling "dizzy" when she stood quickly and "collapsed" onto the floor. Questions hitting back of head. No LOC. Ambulatory without difficulty. No extremity sensation changes or weakness. Pregnancy without complications. No abdominal or back pain. No contractions or LOF from vagina. No vaginal bleeding. Normal vision. Normal PO intake without n/v. No current feelings of lightheadedness; always very transient. No dysuria or urinary frequency. Reports recent travel 3 weeks ago. No  known sick contacts. No specific aggravating or alleviating factors reported.  ROS: As per HPI. All other systems negative.    OBJECTIVE:  Vitals:   12/09/18 1414  BP: 124/81  Pulse: (!) 102  Resp: 18  Temp: 98.8 F (37.1 C)  TempSrc: Oral  SpO2: 100%    General appearance: alert; no distress Eyes: PERRLA; EOMI; conjunctiva normal HENT: normocephalic; atraumatic; TMs normal; nasal mucosa normal; oral mucosa moist Neck: supple with FROM Lungs: clear to auscultation bilaterally Heart: very slight tachycardia; regular rhythm Abdomen: soft, non-tender; bowel sounds normal Extremities: no cyanosis or edema; symmetrical with no gross deformities Skin: warm and dry Neurologic: normal gait; DTR's normal and symmetric.; CN 2-12 grossly intact Psychological: alert and cooperative; normal mood and affect  Investigations: Labs Reviewed  POCT URINALYSIS DIP (DEVICE) - Abnormal; Notable for the following components:   Hgb urine dipstick TRACE (*)    Leukocytes,Ua SMALL (*)    All other components within normal limits   No results found.  No Known Allergies  Past Medical History:  Diagnosis Date  . Back pain   . History of kidney stones   . HS (hereditary spherocytosis) (HCC)    Social History   Socioeconomic History  . Marital status: Married    Spouse name: Not on file  . Number of children: Not on file  . Years of education: Not on file  . Highest education level: Not on file  Occupational History  . Not on file  Social Needs  . Financial resource strain:  Not on file  . Food insecurity:    Worry: Not on file    Inability: Not on file  . Transportation needs:    Medical: Not on file    Non-medical: Not on file  Tobacco Use  . Smoking status: Never Smoker  . Smokeless tobacco: Never Used  Substance and Sexual Activity  . Alcohol use: Not Currently    Comment: occ  . Drug use: Never  . Sexual activity: Not on file  Lifestyle  . Physical activity:    Days per  week: Not on file    Minutes per session: Not on file  . Stress: Not on file  Relationships  . Social connections:    Talks on phone: Not on file    Gets together: Not on file    Attends religious service: Not on file    Active member of club or organization: Not on file    Attends meetings of clubs or organizations: Not on file    Relationship status: Not on file  . Intimate partner violence:    Fear of current or ex partner: Not on file    Emotionally abused: Not on file    Physically abused: Not on file    Forced sexual activity: Not on file  Other Topics Concern  . Not on file  Social History Narrative  . Not on file   Family History  Problem Relation Age of Onset  . Hypertension Mother   . Hypertension Father    Past Surgical History:  Procedure Laterality Date  . CYST EXCISION     breast and axial   . WISDOM TOOTH EXTRACTION        Mardella LaymanHagler, Britian Jentz, MD 12/22/18 [id number]    Mardella LaymanHagler, Wenonah Milo, MD 12/22/18 0900

## 2019-01-21 ENCOUNTER — Other Ambulatory Visit: Payer: Self-pay

## 2019-01-21 ENCOUNTER — Inpatient Hospital Stay (HOSPITAL_COMMUNITY)
Admission: AD | Admit: 2019-01-21 | Discharge: 2019-01-21 | Disposition: A | Discharge status: Home / Self Care | Attending: Obstetrics and Gynecology | Admitting: Obstetrics and Gynecology

## 2019-01-21 ENCOUNTER — Encounter (HOSPITAL_COMMUNITY): Payer: Self-pay

## 2019-01-21 DIAGNOSIS — O368130 Decreased fetal movements, third trimester, not applicable or unspecified: Secondary | ICD-10-CM | POA: Diagnosis not present

## 2019-01-21 DIAGNOSIS — N200 Calculus of kidney: Secondary | ICD-10-CM | POA: Insufficient documentation

## 2019-01-21 DIAGNOSIS — Z3A31 31 weeks gestation of pregnancy: Secondary | ICD-10-CM | POA: Diagnosis not present

## 2019-01-21 DIAGNOSIS — Z3689 Encounter for other specified antenatal screening: Secondary | ICD-10-CM

## 2019-01-21 DIAGNOSIS — O26833 Pregnancy related renal disease, third trimester: Secondary | ICD-10-CM | POA: Diagnosis not present

## 2019-01-21 NOTE — MAU Provider Note (Signed)
History     CSN: [id number]  Arrival date and time: 01/21/19 2228   First Provider Initiated Contact with Patient 01/21/19 2316      Chief Complaint  Patient presents with  . Decreased Fetal Movement   Ms. Maria Mercado is a 28 y.o. G3P1011 at [redacted]w[redacted]d who presents to MAU for feeling decreased fetal movement beginning 1845. Upon provider entering the room, pt reports resumption of normal fetal movement.  Last food/drink: eat 3-4hrs, drink about Smoker? no Current medications/supplements: Wellbutrin, Prozac, PNV Recent AFI: no [place] on file Anterior placenta? no [place] on file Doing FKCs? not regularly Problems this pregnancy include: kidney stones Pt denies prior instances of DFM. Pt denies all risk factors for stillbirth, including, but not limited to: IUGR, placental abruption, infection, genetic/congenital anomalies, fetomaternal hemorrhage, DM, HTN, smoking/drug use, umbilical cord/placental abnormalities, uterine abnormalities, fetal hydrops, arrythmia, platelet dysfunction, IHCP.  Pt denies VB, LOF, ctx, vaginal discharge/odor/itching.  Allergies? Keflex Prenatal care provider/next appt? [facility], next appt 02/03/2019   OB History    Gravida  3   Para  1   Term  1   Preterm      AB  1   Living  1     SAB  1   TAB      Ectopic      Multiple      Live Births  1           Past Medical History:  Diagnosis Date  . Back pain   . History of kidney stones   . HS (hereditary spherocytosis) (HCC)     Past Surgical History:  Procedure Laterality Date  . CYST EXCISION     breast and axial   . WISDOM TOOTH EXTRACTION      Family History  Problem Relation Age of Onset  . Hypertension Mother   . Hypertension Father     Social History   Tobacco Use  . Smoking status: Never Smoker  . Smokeless tobacco: Never Used  Substance Use Topics  . Alcohol use: Not Currently    Comment: occ  . Drug use: Never    Allergies:  Allergies   Allergen Reactions  . Keflex [Cephalexin] Itching    Has taken Keflex but was okay, except for one generic brand that made her itch.     Medications Prior to Admission  Medication Sig Dispense Refill Last Dose  . buPROPion (WELLBUTRIN) 75 MG tablet Take 75 mg by mouth 2 (two) times daily.   01/21/2019 at Unknown time  . FLUoxetine (PROZAC) 20 MG tablet Take 40 mg by mouth daily.   01/21/2019 at Unknown time  . Prenatal Vit-Fe Fumarate-FA (PRENATAL MULTIVITAMIN) TABS tablet Take 2 tablets by mouth daily at 12 noon.   01/21/2019 at Unknown time  . cephALEXin (KEFLEX) 500 MG capsule Take 1 capsule (500 mg total) by mouth 2 (two) times daily. 10 capsule 0   . doxylamine, Sleep, (UNISOM) 25 MG tablet Take 25 mg by mouth at bedtime as needed.   More than a month at Unknown time  . oxycodone (OXY-IR) 5 MG capsule Take 1 capsule (5 mg total) by mouth every 6 (six) hours as needed. 10 capsule 0 More than a month at Unknown time  . promethazine (PHENERGAN) 25 MG tablet Take 1 tablet (25 mg total) by mouth every 6 (six) hours as needed for nausea or vomiting. 30 tablet 1 More than a month at Unknown time  . pyridOXINE (VITAMIN B-6) 50  MG tablet Take 25 mg by mouth 3 (three) times daily with meals.   More than a month at Unknown time  . tamsulosin (FLOMAX) 0.4 MG CAPS capsule Take 1 capsule (0.4 mg total) by mouth daily. 30 capsule 0 More than a month at Unknown time    Review of Systems  Constitutional: Negative for chills, diaphoresis, fatigue and fever.  Respiratory: Negative for shortness of breath.   Cardiovascular: Negative for chest pain.  Gastrointestinal: Negative for abdominal pain, constipation, diarrhea, nausea and vomiting.  Genitourinary: Negative for dysuria, flank pain, frequency, pelvic pain, urgency, vaginal bleeding and vaginal discharge.  Neurological: Negative for dizziness, weakness, light-headedness and headaches.   Physical Exam   Blood pressure 116/77, pulse (!) 103,  temperature 98.4 F (36.9 C), temperature source Oral, resp. rate 19, height 5\' 5"  (1.651 m), weight 80.7 kg, last menstrual period 06/12/2018, SpO2 100 %.  Patient Vitals for the past 24 hrs:  BP Temp Temp src Pulse Resp SpO2 Height Weight  01/21/19 2256 116/77 98.4 F (36.9 C) Oral (!) 103 19 100 % 5\' 5"  (1.651 m) 80.7 kg    Physical Exam  Constitutional: She is oriented to person, place, and time. She appears well-developed and well-nourished. No distress.  HENT:  Head: Normocephalic and atraumatic.  Respiratory: Effort normal.  GI: Soft. She exhibits no distension and no mass. There is no abdominal tenderness. There is no rebound and no guarding.  Neurological: She is alert and oriented to person, place, and time.  Skin: Skin is warm and dry. She is not diaphoretic.  Psychiatric: She has a normal mood and affect. Her behavior is normal. Judgment and thought content normal.   No results found for this or any previous visit (from the past 24 hour(s)).  No results found.  MAU Course  Procedures  MDM -DFM -pt reports return to normal of fetal movement upon provider entering the room -no previous episodes of DFM -EFM: reactive with single variable       -baseline: 135       -variability: moderate       -accels: present, 15x15       -decels: single variable       -TOCO: no ctx -pt discharged to home in stable condition  Orders Placed This Encounter  Procedures  . Discharge patient    Order Specific Question:   Discharge disposition    Answer:   01-Home or Self Care [1]    Order Specific Question:   Discharge patient date    Answer:   01/21/2019   No orders of the defined types were placed in this encounter.  Assessment and Plan   1. Decreased fetal movements in third trimester, single or unspecified fetus   2. [redacted] weeks gestation of pregnancy   3. NST (non-stress test) reactive    Allergies as of 01/21/2019      Reactions   Keflex [cephalexin] Itching   Has taken  Keflex but was okay, except for one generic brand that made her itch.       Medication List    STOP taking these medications   cephALEXin 500 MG capsule Commonly known as:  KEFLEX     TAKE these medications   buPROPion 75 MG tablet Commonly known as:  WELLBUTRIN Take 75 mg by mouth 2 (two) times daily.   doxylamine (Sleep) 25 MG tablet Commonly known as:  UNISOM Take 25 mg by mouth at bedtime as needed.   FLUoxetine 20 MG tablet Commonly  known as:  PROZAC Take 40 mg by mouth daily.   oxycodone 5 MG capsule Commonly known as:  OXY-IR Take 1 capsule (5 mg total) by mouth every 6 (six) hours as needed.   prenatal multivitamin Tabs tablet Take 2 tablets by mouth daily at 12 noon.   promethazine 25 MG tablet Commonly known as:  PHENERGAN Take 1 tablet (25 mg total) by mouth every 6 (six) hours as needed for nausea or vomiting.   pyridOXINE 50 MG tablet Commonly known as:  VITAMIN B-6 Take 25 mg by mouth 3 (three) times daily with meals.   tamsulosin 0.4 MG Caps capsule Commonly known as:  FLOMAX Take 1 capsule (0.4 mg total) by mouth daily.       -discussed normal fetal movement across gestational age -discussed FKC vs. General fetal movement monitoring at home -strict fetal movement/return MAU precautions given -pt discharged to home in stable condition  Odie Sera Elishah Ashmore 01/21/2019, 11:35 PM

## 2019-01-21 NOTE — MAU Note (Signed)
Pt reports DFM today. Has not felt baby move since at least 1845, but did feel baby move once in the waiting room. Reports some mild lower abdominal cramping. Reports an increase in vaginal discharge. Was in the office today and was told she had a yeast infection. Denies vaginal bleeding. Picked up monistat but has not used tonight.

## 2019-01-21 NOTE — Discharge Instructions (Signed)

## 2019-01-22 ENCOUNTER — Other Ambulatory Visit: Payer: Self-pay

## 2019-01-22 ENCOUNTER — Encounter (HOSPITAL_COMMUNITY): Payer: Self-pay

## 2019-01-22 ENCOUNTER — Inpatient Hospital Stay (HOSPITAL_COMMUNITY)
Admission: AD | Admit: 2019-01-22 | Discharge: 2019-01-23 | Disposition: A | Discharge status: Home / Self Care | Payer: No Typology Code available for payment source | Attending: Obstetrics and Gynecology | Admitting: Obstetrics and Gynecology

## 2019-01-22 DIAGNOSIS — O212 Late vomiting of pregnancy: Secondary | ICD-10-CM | POA: Insufficient documentation

## 2019-01-22 DIAGNOSIS — R042 Hemoptysis: Secondary | ICD-10-CM | POA: Diagnosis not present

## 2019-01-22 DIAGNOSIS — K219 Gastro-esophageal reflux disease without esophagitis: Secondary | ICD-10-CM | POA: Diagnosis not present

## 2019-01-22 DIAGNOSIS — Z3A31 31 weeks gestation of pregnancy: Secondary | ICD-10-CM | POA: Diagnosis not present

## 2019-01-22 DIAGNOSIS — O219 Vomiting of pregnancy, unspecified: Secondary | ICD-10-CM

## 2019-01-22 DIAGNOSIS — O99613 Diseases of the digestive system complicating pregnancy, third trimester: Secondary | ICD-10-CM | POA: Insufficient documentation

## 2019-01-22 MED ORDER — FAMOTIDINE 20 MG PO TABS
40.0000 mg | ORAL_TABLET | Freq: Once | ORAL | Status: AC
  Administered 2019-01-23: 40 mg via ORAL
  Filled 2019-01-22: qty 2

## 2019-01-22 MED ORDER — ONDANSETRON 4 MG PO TBDP
8.0000 mg | ORAL_TABLET | Freq: Once | ORAL | Status: AC
  Administered 2019-01-23: 01:00:00 8 mg via ORAL
  Filled 2019-01-22: qty 2

## 2019-01-22 NOTE — MAU Provider Note (Signed)
History     CSN: [id number]  Arrival date and time: 01/22/19 2316   First Provider Initiated Contact with Patient 01/22/19 2349      Chief Complaint  Patient presents with  . Emesis   HPI   [name] is a 28 y.o. female G18P1011 @ [redacted]w[redacted]d here in MAU with N/V and blood noted in her vomit. She vomited 3 times prior to coming in. She does have phenergan at home however did not take this because it makes her tired. She denies vomiting now or heartburn. She complains of continued nausea. No one from her home is feeling sick. The vomiting was with abrupt onset.   She ate dinner around 5:15 which was grilled chicken and rice and then had an orange Gatorade. She vomited at 10 PM.   OB History    Gravida  3   Para  1   Term  1   Preterm      AB  1   Living  1     SAB  1   TAB      Ectopic      Multiple      Live Births  1           Past Medical History:  Diagnosis Date  . Back pain   . History of kidney stones   . HS (hereditary spherocytosis) (HCC)     Past Surgical History:  Procedure Laterality Date  . CYST EXCISION     breast and axial   . WISDOM TOOTH EXTRACTION      Family History  Problem Relation Age of Onset  . Hypertension Mother   . Hypertension Father     Social History   Tobacco Use  . Smoking status: Never Smoker  . Smokeless tobacco: Never Used  Substance Use Topics  . Alcohol use: Not Currently    Comment: occ  . Drug use: Never    Allergies:  Allergies  Allergen Reactions  . Keflex [Cephalexin] Itching    Has taken Keflex but was okay, except for one generic brand that made her itch.     Medications Prior to Admission  Medication Sig Dispense Refill Last Dose  . buPROPion (WELLBUTRIN) 75 MG tablet Take 75 mg by mouth 2 (two) times daily.   01/21/2019 at Unknown time  . doxylamine, Sleep, (UNISOM) 25 MG tablet Take 25 mg by mouth at bedtime as needed.   More than a month at Unknown time  . FLUoxetine (PROZAC) 20  MG tablet Take 40 mg by mouth daily.   01/21/2019 at Unknown time  . oxycodone (OXY-IR) 5 MG capsule Take 1 capsule (5 mg total) by mouth every 6 (six) hours as needed. 10 capsule 0 More than a month at Unknown time  . Prenatal Vit-Fe Fumarate-FA (PRENATAL MULTIVITAMIN) TABS tablet Take 2 tablets by mouth daily at 12 noon.   01/21/2019 at Unknown time  . promethazine (PHENERGAN) 25 MG tablet Take 1 tablet (25 mg total) by mouth every 6 (six) hours as needed for nausea or vomiting. 30 tablet 1 More than a month at Unknown time  . pyridOXINE (VITAMIN B-6) 50 MG tablet Take 25 mg by mouth 3 (three) times daily with meals.   More than a month at Unknown time  . tamsulosin (FLOMAX) 0.4 MG CAPS capsule Take 1 capsule (0.4 mg total) by mouth daily. 30 capsule 0 More than a month at Unknown time   Results for orders placed or performed during the  hospital encounter of 01/22/19 (from the past 48 hour(s))  Urinalysis, Routine w reflex microscopic     Status: Abnormal   Collection Time: 01/22/19 11:50 PM  Result Value Ref Range   Color, Urine YELLOW YELLOW   APPearance HAZY (A) CLEAR   Specific Gravity, Urine 1.014 1.005 - 1.030   pH 6.0 5.0 - 8.0   Glucose, UA NEGATIVE NEGATIVE mg/dL   Hgb urine dipstick SMALL (A) NEGATIVE   Bilirubin Urine NEGATIVE NEGATIVE   Ketones, ur NEGATIVE NEGATIVE mg/dL   Protein, ur NEGATIVE NEGATIVE mg/dL   Nitrite NEGATIVE NEGATIVE   Leukocytes,Ua LARGE (A) NEGATIVE   RBC / HPF 11-20 0 - 5 RBC/hpf   WBC, UA 6-10 0 - 5 WBC/hpf   Bacteria, UA RARE (A) NONE SEEN   Squamous Epithelial / LPF 6-10 0 - 5   Mucus PRESENT     Comment: Performed at [facility], [address] [place], [place] [address]   Review of Systems  Constitutional: Negative for fever.  Gastrointestinal: Positive for nausea and vomiting. Negative for abdominal pain.   Physical Exam   Blood pressure 118/77, pulse 94, temperature 99 F (37.2 C), resp. rate 18, height 5\' 5"  (1.651 m), weight 80.4  kg, last menstrual period 06/12/2018, SpO2 98 %.  Physical Exam  Constitutional: She is oriented to person, place, and time. She appears well-developed and well-nourished. No distress.  HENT:  Head: Normocephalic.  Eyes: Pupils are equal, round, and reactive to light.  Respiratory: Effort normal.  Musculoskeletal: Normal range of motion.  Neurological: She is alert and oriented to person, place, and time.  Skin: Skin is warm. She is not diaphoretic.   Fetal Tracing: Baseline: 130 bpm Variability: Moderate  Accelerations: 15x15 Decelerations: None Toco: Quiet   MAU Course  Procedures  None  MDM  Reactive NST No nausea or vomiting while in MAU Picture that patient provided shows particles of food with red blood scattered.  Zofran given 4 mg PO  Assessment and Plan   A:  1. Nausea and vomiting in pregnancy   2. Gastroesophageal reflux disease, esophagitis presence not specified   3. Hemoptysis     P:  Discharge home in stable condition Return to MAU if symptoms worsen Ok to use phenergan     Rasch, Harolyn RutherfordJennifer I, NP 01/24/2019  9:01 AM

## 2019-01-22 NOTE — MAU Note (Signed)
Pt reports that she has vomited blood time x3 tonight. Pt reports she has not had vomiting for about 4-5 weeks. Reports that she still feels nauseous. Pt reports some cramping in abdomen but thinks it is related to the vomiting. Reports that she had some heartburn about 20 mins prior to the vomiting starting. Has not tried anything for vomiting or heartburn. Denies recent sick contacts. Reports that she had grilled chicken and rice for dinner around 1730 this evening. Pt denies vaginal bleeding, LOF. Reports good fetal movement.

## 2019-01-23 DIAGNOSIS — K219 Gastro-esophageal reflux disease without esophagitis: Secondary | ICD-10-CM

## 2019-01-23 DIAGNOSIS — O219 Vomiting of pregnancy, unspecified: Secondary | ICD-10-CM

## 2019-01-23 DIAGNOSIS — R042 Hemoptysis: Secondary | ICD-10-CM

## 2019-01-23 DIAGNOSIS — Z3A31 31 weeks gestation of pregnancy: Secondary | ICD-10-CM

## 2019-01-23 LAB — URINALYSIS, ROUTINE W REFLEX MICROSCOPIC
Bilirubin Urine: NEGATIVE
Glucose, UA: NEGATIVE mg/dL
Ketones, ur: NEGATIVE mg/dL
Nitrite: NEGATIVE
Protein, ur: NEGATIVE mg/dL
Specific Gravity, Urine: 1.014 (ref 1.005–1.030)
pH: 6 (ref 5.0–8.0)

## 2019-01-23 NOTE — Discharge Instructions (Signed)
Barrett's Esophagus ° °Barrett's esophagus occurs when the tissue that lines the esophagus changes or becomes damaged. The esophagus is the tube that carries food from the throat to the stomach. With Barrett's esophagus, the cells that line the esophagus are replaced by cells that are similar to the lining of the intestines (intestinal metaplasia). °Barrett's esophagus itself may not cause any symptoms. However, many people who have Barrett's esophagus also have gastroesophageal reflux disease (GERD), which may cause symptoms such as heartburn. Over time, a few people with this condition may develop cancer of the esophagus. Treatment may include medicines, procedures to destroy the abnormal cells, or surgery. °What are the causes? °The exact cause of this condition is not known. In some cases, the condition develops from damage to the lining of the esophagus caused by GERD. GERD occurs when stomach acids flow up from the stomach into the esophagus. Frequent symptoms of GERD may cause intestinal metaplasia or cause cell changes (dysplasia). °What increases the risk? °You are more likely to develop this condition if you: °· Have GERD. °· Are female. °· Are Caucasian. °· Are obese. °· Are older than 50. °· Have a hiatal hernia. This is a condition in which part of your stomach bulges into your chest. °· Smoke. °What are the signs or symptoms? °People with Barrett's esophagus often have no symptoms. However, many people with this condition also have GERD. Symptoms of GERD may include: °· Heartburn. °· Difficulty swallowing. °· Dry cough. °How is this diagnosed? °This condition may be diagnosed based on: °· Results of an upper gastrointestinal endoscopy. For this exam, a thin, flexible tube with a light and a camera on the end (endoscope) is passed down your esophagus. Your health care provider can view the inside of your esophagus during this procedure. °· Results of a biopsy. For this procedure, several tissue samples  are removed (biopsy) from your esophagus. They are then checked for intestinal metaplasia or dysplasia. °How is this treated? °Treatment for this condition may include: °· Medicines (proton pump inhibitors, or PPIs) to decrease or stop GERD. °· Periodic endoscopic exams to make sure that cancer is not developing. °· A procedure or surgery for dysplasia. This may include: °? Removal or destruction of abnormal cells. °? Removal of part of the esophagus (esophagectomy). °Follow these instructions at home: °Eating and drinking °· Eat more fruits and vegetables. °· Avoid fatty foods. °· Eat small, frequent meals instead of large meals. °· Avoid foods that cause heartburn. These foods include: °? Coffee and alcoholic drinks. °? Tomatoes and foods made with tomatoes. °? Greasy or spicy foods. °? Chocolate and peppermint. °· Do not drink alcohol. °General instructions °· Take over-the-counter and prescription medicines only as told by your health care provider. °· Do not use any products that contain nicotine or tobacco, such as cigarettes and e-cigarettes. If you need help quitting, ask your health care provider. °· If you are being treated for GERD, make sure you take medicines and follow all instructions as told by your health care provider. °· Keep all follow-up visits as told by your health care provider. This is important. °Contact a health care provider if: °· You have heartburn or GERD symptoms. °· You have difficulty swallowing. °Get help right away if: °· You have chest pain. °· You are unable to swallow. °· You vomit blood or material that looks like coffee grounds. °· Your stool (feces) is bright red or dark. °Summary °· Barrett's esophagus occurs when the tissue that lines   the esophagus changes or becomes damaged. °· Barrett's esophagus may be diagnosed with an upper gastrointestinal endoscopy and a biopsy. °· Treatment may include medicines, procedures to remove abnormal cells, or surgery. °· Follow your  health care provider's instructions about what to eat and drink, what medicines to take, and when to call for help. °This information is not intended to replace advice given to you by your health care provider. Make sure you discuss any questions you have with your health care provider. °Document Released: 11/15/2003 Document Revised: 12/21/2017 Document Reviewed: 12/21/2017 °Elsevier Interactive Patient Education © 2019 [employer] ° °

## 2019-01-24 LAB — CULTURE, OB URINE
Culture: >=100000 — AB
Special Requests: NORMAL

## 2019-03-04 LAB — OB RESULTS CONSOLE GBS: GBS: NEGATIVE

## 2019-03-17 ENCOUNTER — Encounter (HOSPITAL_COMMUNITY): Payer: Self-pay | Admitting: *Deleted

## 2019-03-17 ENCOUNTER — Telehealth (HOSPITAL_COMMUNITY): Payer: Self-pay | Admitting: *Deleted

## 2019-03-17 NOTE — Telephone Encounter (Signed)
Preadmission screen  

## 2019-03-20 ENCOUNTER — Encounter (HOSPITAL_COMMUNITY): Payer: Self-pay | Admitting: *Deleted

## 2019-03-20 ENCOUNTER — Other Ambulatory Visit: Payer: Self-pay

## 2019-03-20 ENCOUNTER — Inpatient Hospital Stay (HOSPITAL_COMMUNITY)
Admission: RE | Admit: 2019-03-20 | Discharge: 2019-03-20 | Disposition: A | Discharge status: Home / Self Care | Payer: No Typology Code available for payment source | Attending: Obstetrics and Gynecology | Admitting: Obstetrics and Gynecology

## 2019-03-20 DIAGNOSIS — O9989 Other specified diseases and conditions complicating pregnancy, childbirth and the puerperium: Secondary | ICD-10-CM | POA: Diagnosis not present

## 2019-03-20 DIAGNOSIS — Z3689 Encounter for other specified antenatal screening: Secondary | ICD-10-CM | POA: Diagnosis not present

## 2019-03-20 DIAGNOSIS — Z3A39 39 weeks gestation of pregnancy: Secondary | ICD-10-CM | POA: Diagnosis not present

## 2019-03-20 DIAGNOSIS — R1030 Lower abdominal pain, unspecified: Secondary | ICD-10-CM | POA: Diagnosis not present

## 2019-03-20 DIAGNOSIS — Z3493 Encounter for supervision of normal pregnancy, unspecified, third trimester: Secondary | ICD-10-CM

## 2019-03-20 DIAGNOSIS — O42913 Preterm premature rupture of membranes, unspecified as to length of time between rupture and onset of labor, third trimester: Secondary | ICD-10-CM | POA: Diagnosis not present

## 2019-03-20 MED ORDER — TERCONAZOLE 0.4 % VA CREA: 1.0000 | TOPICAL_CREAM | Freq: Every day | VAGINAL | 0 refills | Status: DC

## 2019-03-20 NOTE — Discharge Instructions (Signed)

## 2019-03-20 NOTE — MAU Provider Note (Signed)
First Provider Initiated Contact with Patient 03/20/19 1553       S: Ms. Maria Mercado is a 28 y.o. G3P1011 at [redacted]w[redacted]d  who presents to MAU today complaining of leaking of fluid for two days. She denies gush, denies continuous leaking. Also reports irregulare lower abdominal cramping, denies contraction pain.  She denies vaginal bleeding. She reports normal fetal movement.    O: BP 116/85 (BP Location: Right Arm)   Pulse (!) 104   Temp 98.2 F (36.8 C) (Oral)   Resp 16   Ht 5\' 5"  (1.651 m)   Wt 84 kg   LMP 06/12/2018   SpO2 100%   BMI 30.82 kg/m  GENERAL: Well-developed, well-nourished female in no acute distress.  HEAD: Normocephalic, atraumatic.  CHEST: Normal effort of breathing, regular heart rate ABDOMEN: Soft, nontender, gravid GU: thick white clusters of vaginal discharge noted on labia and throughout vaginal vault on speculum exam. No discharge from cervical os  Cervical exam:  Dilation: 1 Effacement (%): 50 Cervical Position: Posterior Station: -3 Exam by:: Maryelizabeth Kaufmann CNM   Fetal Monitoring: Baseline: 140 Variability: Moderate Accelerations: Positive 15 x 15 Decelerations: None Contractions: Rare, UI noted  A: SIUP at [redacted]w[redacted]d  Cervix unchanged from clinic, 1/50/ballotable, vertex by suture Negative pooling, negative gush with Valsalva, negative Fern x 2 Treat presumptively for vulvovaginal candidiasis per pt request  Meds ordered this encounter  Medications  . terconazole (TERAZOL 7) 0.4 % vaginal cream    Sig: Place 1 applicator vaginally at bedtime. Use for seven days    Dispense:  45 g    Refill:  0    Order Specific Question:   Supervising Provider    Answer:   Florian Buff [[id number]]   P: Discharge home in stable condition with labor precautions Next Ob appt is Wednesday 03/23/19  Darlina Rumpf, CNM 03/20/2019 4:20 PM

## 2019-03-20 NOTE — MAU Note (Signed)
Maria Mercado is a 28 y.o. at [redacted]w[redacted]d here in MAU reporting: states she has been leaking for 2 days. States it might just be increased discharge, it is clear and odorless. Has not had a big gush, sometimes at night when she gets up she feels some trickling. Having some irregular cramping. No bleeding. +FM  Onset of complaint: ongoing  Pain score: 3/10  Vitals:   03/20/19 1518  BP: 116/85  Pulse: (!) 104  Resp: 16  Temp: 98.2 F (36.8 C)  SpO2: 100%     FHT: +FM  Lab orders placed from triage: none

## 2019-03-25 ENCOUNTER — Inpatient Hospital Stay (HOSPITAL_COMMUNITY)
Admission: RE | Admit: 2019-03-25 | Discharge: 2019-03-25 | Disposition: A | Discharge status: Home / Self Care | Payer: Non-veteran care | Source: Ambulatory Visit

## 2019-03-29 ENCOUNTER — Encounter (HOSPITAL_COMMUNITY): Payer: Non-veteran care

## 2019-03-30 ENCOUNTER — Other Ambulatory Visit (HOSPITAL_COMMUNITY)
Admission: RE | Admit: 2019-03-30 | Discharge: 2019-03-30 | Disposition: A | Discharge status: Home / Self Care | Source: Ambulatory Visit | Attending: Obstetrics and Gynecology | Admitting: Obstetrics and Gynecology

## 2019-03-30 ENCOUNTER — Other Ambulatory Visit: Payer: Self-pay

## 2019-03-30 ENCOUNTER — Other Ambulatory Visit: Payer: Self-pay | Admitting: Obstetrics and Gynecology

## 2019-03-30 DIAGNOSIS — Z1159 Encounter for screening for other viral diseases: Secondary | ICD-10-CM | POA: Diagnosis not present

## 2019-03-30 LAB — SARS CORONAVIRUS 2 (TAT 6-24 HRS): SARS Coronavirus 2: NEGATIVE

## 2019-03-30 NOTE — MAU Note (Signed)
Swab collected without difficulty. 

## 2019-03-31 ENCOUNTER — Other Ambulatory Visit: Payer: Self-pay | Admitting: Obstetrics and Gynecology

## 2019-03-31 NOTE — H&P (Signed)
Maria Mercado is a 28 y.o. female G3P1011 at 36 0/7 weeks (EDD 03/24/19 by  presenting for IOL post-due date. Prenatal care complicated by kidney stones, depression which is stable on medication and h/o PCOS. 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   OB History    Gravida  3   Para  1   Term  1   Preterm      AB  1   Living  1     SAB  1   TAB      Ectopic      Multiple      Live Births  1         2012 NSVD 8#2oz SAB x 1  Past Medical History:  Diagnosis Date  . Back pain   . History of kidney stones   . HS (hereditary spherocytosis) ([place])    Past Surgical History:  Procedure Laterality Date  . CYST EXCISION     breast and axial   . WISDOM TOOTH EXTRACTION     Family History: family history includes Heart attack in her maternal grandfather; Hypertension in her father and mother; Stroke in her maternal grandfather. Social History:  reports that she has never smoked. She has never used smokeless tobacco. She reports previous alcohol use. She reports that she does not use drugs.     Maternal Diabetes: No Genetic Screening: Normal Maternal Ultrasounds/Referrals: Normal Fetal Ultrasounds or other Referrals:  None Maternal Substance Abuse:  No Significant Maternal Medications:  Meds include: Prozac Other: Wellbutrin Significant Maternal Lab Results:  None Other Comments:  None  Review of Systems  Constitutional: Negative for fever.  Gastrointestinal: Negative for abdominal pain.  Genitourinary: Negative for dysuria.   Maternal Medical History:  Contractions: Frequency: irregular.   Perceived severity is mild.    Fetal activity: Perceived fetal activity is normal.    Prenatal complications: Kidney stones, depression  Prenatal Complications - Diabetes: none.      Last menstrual period 06/12/2018. Maternal Exam:  Uterine Assessment: Contraction strength is mild.  Contraction frequency is  irregular.   Abdomen: Patient reports no abdominal tenderness. Fetal presentation: vertex  Introitus: Normal vulva. Normal vagina.  Pelvis: adequate for delivery.      Physical Exam  Constitutional: She appears well-developed.  Cardiovascular: Normal rate and regular rhythm.  Respiratory: Effort normal.  GI: Soft.  Genitourinary:    Vulva and vagina normal.   Musculoskeletal: Normal range of motion.  Neurological: She is alert.  Psychiatric: She has a normal mood and affect.    Prenatal labs: ABO, Rh: O/Positive/-- (12/12 0000) Antibody: Negative (12/12 0000) Rubella: Immune (12/12 0000) RPR: Nonreactive (12/12 0000)  HBsAg: Negative (12/12 0000)  HIV: Non-reactive (12/12 0000)  GBS: Negative (06/26 0000)  One hour GCT WNL First trimester screen and AFP WNL Essential panel negative  Assessment/Plan: Pt for IOL at postdates. Plan AROM and pitocin  Logan Bores 03/31/2019, 5:27 PM

## 2019-03-31 NOTE — H&P (Deleted)
  The note originally documented on this encounter has been moved the the encounter in which it belongs.  

## 2019-04-01 ENCOUNTER — Inpatient Hospital Stay (HOSPITAL_COMMUNITY)
Admission: RE | Admit: 2019-04-01 | Discharge: 2019-04-03 | DRG: 807 | Disposition: A | Discharge status: Home / Self Care | Payer: No Typology Code available for payment source | Attending: Obstetrics and Gynecology | Admitting: Obstetrics and Gynecology

## 2019-04-01 ENCOUNTER — Inpatient Hospital Stay (HOSPITAL_COMMUNITY): Payer: No Typology Code available for payment source | Admitting: Anesthesiology

## 2019-04-01 ENCOUNTER — Other Ambulatory Visit: Payer: Self-pay

## 2019-04-01 ENCOUNTER — Encounter (HOSPITAL_COMMUNITY): Payer: Self-pay | Admitting: *Deleted

## 2019-04-01 ENCOUNTER — Inpatient Hospital Stay (HOSPITAL_COMMUNITY): Payer: No Typology Code available for payment source

## 2019-04-01 DIAGNOSIS — O480 Post-term pregnancy: Secondary | ICD-10-CM | POA: Diagnosis present

## 2019-04-01 DIAGNOSIS — D649 Anemia, unspecified: Secondary | ICD-10-CM | POA: Diagnosis present

## 2019-04-01 DIAGNOSIS — Z3A41 41 weeks gestation of pregnancy: Secondary | ICD-10-CM | POA: Diagnosis not present

## 2019-04-01 DIAGNOSIS — O9902 Anemia complicating childbirth: Secondary | ICD-10-CM | POA: Diagnosis present

## 2019-04-01 HISTORY — DX: Anxiety disorder, unspecified: F41.9

## 2019-04-01 HISTORY — DX: Depression, unspecified: F32.A

## 2019-04-01 LAB — RPR: RPR Ser Ql: NONREACTIVE

## 2019-04-01 LAB — COMPREHENSIVE METABOLIC PANEL
ALT: 12 U/L (ref 0–44)
AST: 20 U/L (ref 15–41)
Albumin: 2.7 g/dL — ABNORMAL LOW (ref 3.5–5.0)
Alkaline Phosphatase: 239 U/L — ABNORMAL HIGH (ref 38–126)
Anion gap: 10 (ref 5–15)
BUN: 6 mg/dL (ref 6–20)
CO2: 20 mmol/L — ABNORMAL LOW (ref 22–32)
Calcium: 9 mg/dL (ref 8.9–10.3)
Chloride: 106 mmol/L (ref 98–111)
Creatinine, Ser: 0.63 mg/dL (ref 0.44–1.00)
GFR calc Af Amer: >60 mL/min (ref >60)
GFR calc non Af Amer: >60 mL/min (ref >60)
Glucose, Bld: 87 mg/dL (ref 70–99)
Potassium: 3.5 mmol/L (ref 3.5–5.1)
Sodium: 136 mmol/L (ref 135–145)
Total Bilirubin: 0.4 mg/dL (ref 0.3–1.2)
Total Protein: 6.3 g/dL — ABNORMAL LOW (ref 6.5–8.1)

## 2019-04-01 LAB — CBC
HCT: 32 % — ABNORMAL LOW (ref 36.0–46.0)
Hemoglobin: 9.4 g/dL — ABNORMAL LOW (ref 12.0–15.0)
MCH: 21.7 pg — ABNORMAL LOW (ref 26.0–34.0)
MCHC: 29.4 g/dL — ABNORMAL LOW (ref 30.0–36.0)
MCV: 73.7 fL — ABNORMAL LOW (ref 80.0–100.0)
Platelets: 255 10*3/uL (ref 150–400)
RBC: 4.34 MIL/uL (ref 3.87–5.11)
RDW: 17.2 % — ABNORMAL HIGH (ref 11.5–15.5)
WBC: 9.7 10*3/uL (ref 4.0–10.5)
nRBC: 0.2 % (ref 0.0–0.2)

## 2019-04-01 LAB — ABO/RH: ABO/RH(D): O POS

## 2019-04-01 LAB — PROTEIN / CREATININE RATIO, URINE
Creatinine, Urine: 112.75 mg/dL
Protein Creatinine Ratio: 0.32 mg/mg{Cre} — ABNORMAL HIGH (ref 0.00–0.15)
Total Protein, Urine: 36 mg/dL

## 2019-04-01 LAB — TYPE AND SCREEN
ABO/RH(D): O POS
Antibody Screen: NEGATIVE

## 2019-04-01 MED ORDER — TETANUS-DIPHTH-ACELL PERTUSSIS 5-2.5-18.5 LF-MCG/0.5 IM SUSP: 0.5000 mL | Freq: Once | INTRAMUSCULAR | Status: DC

## 2019-04-01 MED ORDER — LACTATED RINGERS IV SOLN: 500.0000 mL | Freq: Once | INTRAVENOUS | Status: DC

## 2019-04-01 MED ORDER — COCONUT OIL OIL: 1.0000 "application " | TOPICAL_OIL | Status: DC | PRN

## 2019-04-01 MED ORDER — FENTANYL CITRATE (PF) 100 MCG/2ML IJ SOLN
50.0000 ug | INTRAMUSCULAR | Status: DC | PRN
  Administered 2019-04-01: 100 ug via INTRAVENOUS
  Filled 2019-04-01: qty 2

## 2019-04-01 MED ORDER — DIPHENHYDRAMINE HCL 50 MG/ML IJ SOLN: 12.5000 mg | INTRAMUSCULAR | Status: DC | PRN

## 2019-04-01 MED ORDER — OXYCODONE-ACETAMINOPHEN 5-325 MG PO TABS: 2.0000 | ORAL_TABLET | ORAL | Status: DC | PRN

## 2019-04-01 MED ORDER — WITCH HAZEL-GLYCERIN EX PADS: 1.0000 "application " | MEDICATED_PAD | CUTANEOUS | Status: DC | PRN

## 2019-04-01 MED ORDER — ONDANSETRON HCL 4 MG PO TABS
4.0000 mg | ORAL_TABLET | ORAL | Status: DC | PRN
  Administered 2019-04-02 (×2): 4 mg via ORAL
  Filled 2019-04-01 (×2): qty 1

## 2019-04-01 MED ORDER — LACTATED RINGERS IV SOLN: 500.0000 mL | INTRAVENOUS | Status: DC | PRN

## 2019-04-01 MED ORDER — SOD CITRATE-CITRIC ACID 500-334 MG/5ML PO SOLN
30.0000 mL | ORAL | Status: DC | PRN
  Administered 2019-04-01: 30 mL via ORAL
  Filled 2019-04-01: qty 30

## 2019-04-01 MED ORDER — LACTATED RINGERS IV SOLN
INTRAVENOUS | Status: DC
  Administered 2019-04-01 (×2): via INTRAVENOUS

## 2019-04-01 MED ORDER — BENZOCAINE-MENTHOL 20-0.5 % EX AERO
1.0000 "application " | INHALATION_SPRAY | CUTANEOUS | Status: DC | PRN
  Administered 2019-04-02: 1 via TOPICAL
  Filled 2019-04-01: qty 56

## 2019-04-01 MED ORDER — EPHEDRINE 5 MG/ML INJ: 10.0000 mg | INTRAVENOUS | Status: DC | PRN

## 2019-04-01 MED ORDER — IBUPROFEN 600 MG PO TABS
600.0000 mg | ORAL_TABLET | Freq: Four times a day (QID) | ORAL | Status: DC
  Administered 2019-04-02 – 2019-04-03 (×8): 600 mg via ORAL
  Filled 2019-04-01 (×8): qty 1

## 2019-04-01 MED ORDER — PHENYLEPHRINE 40 MCG/ML (10ML) SYRINGE FOR IV PUSH (FOR BLOOD PRESSURE SUPPORT): 80.0000 ug | PREFILLED_SYRINGE | INTRAVENOUS | Status: DC | PRN

## 2019-04-01 MED ORDER — ACETAMINOPHEN 325 MG PO TABS: 650.0000 mg | ORAL_TABLET | ORAL | Status: DC | PRN

## 2019-04-01 MED ORDER — ZOLPIDEM TARTRATE 5 MG PO TABS: 5.0000 mg | ORAL_TABLET | Freq: Every evening | ORAL | Status: DC | PRN

## 2019-04-01 MED ORDER — PHENYLEPHRINE 40 MCG/ML (10ML) SYRINGE FOR IV PUSH (FOR BLOOD PRESSURE SUPPORT)
80.0000 ug | PREFILLED_SYRINGE | INTRAVENOUS | Status: DC | PRN
  Filled 2019-04-01: qty 10

## 2019-04-01 MED ORDER — LIDOCAINE-EPINEPHRINE (PF) 2 %-1:200000 IJ SOLN
INTRAMUSCULAR | Status: DC | PRN
  Administered 2019-04-01 (×2): 3 mL via EPIDURAL

## 2019-04-01 MED ORDER — OXYTOCIN 40 UNITS IN NORMAL SALINE INFUSION - SIMPLE MED
2.5000 [IU]/h | INTRAVENOUS | Status: DC
  Administered 2019-04-01: 2.5 [IU]/h via INTRAVENOUS

## 2019-04-01 MED ORDER — DIBUCAINE (PERIANAL) 1 % EX OINT: 1.0000 "application " | TOPICAL_OINTMENT | CUTANEOUS | Status: DC | PRN

## 2019-04-01 MED ORDER — OXYTOCIN BOLUS FROM INFUSION
500.0000 mL | Freq: Once | INTRAVENOUS | Status: AC
  Administered 2019-04-01: 22:00:00 500 mL via INTRAVENOUS

## 2019-04-01 MED ORDER — SIMETHICONE 80 MG PO CHEW: 80.0000 mg | CHEWABLE_TABLET | ORAL | Status: DC | PRN

## 2019-04-01 MED ORDER — FENTANYL-BUPIVACAINE-NACL 0.5-0.125-0.9 MG/250ML-% EP SOLN
12.0000 mL/h | EPIDURAL | Status: DC | PRN
  Filled 2019-04-01: qty 250

## 2019-04-01 MED ORDER — LIDOCAINE HCL (PF) 1 % IJ SOLN: 30.0000 mL | INTRAMUSCULAR | Status: DC | PRN

## 2019-04-01 MED ORDER — DIPHENHYDRAMINE HCL 25 MG PO CAPS: 25.0000 mg | ORAL_CAPSULE | Freq: Four times a day (QID) | ORAL | Status: DC | PRN

## 2019-04-01 MED ORDER — ONDANSETRON HCL 4 MG/2ML IJ SOLN
4.0000 mg | Freq: Four times a day (QID) | INTRAMUSCULAR | Status: DC | PRN
  Administered 2019-04-01 (×2): 4 mg via INTRAVENOUS
  Filled 2019-04-01 (×2): qty 2

## 2019-04-01 MED ORDER — PRENATAL MULTIVITAMIN CH
1.0000 | ORAL_TABLET | Freq: Every day | ORAL | Status: DC
  Administered 2019-04-02 – 2019-04-03 (×2): 1 via ORAL
  Filled 2019-04-01 (×2): qty 1

## 2019-04-01 MED ORDER — SODIUM CHLORIDE (PF) 0.9 % IJ SOLN
INTRAMUSCULAR | Status: DC | PRN
  Administered 2019-04-01: 12 mL/h via EPIDURAL

## 2019-04-01 MED ORDER — OXYTOCIN 40 UNITS IN NORMAL SALINE INFUSION - SIMPLE MED
1.0000 m[IU]/min | INTRAVENOUS | Status: DC
  Administered 2019-04-01: 08:00:00 2 m[IU]/min via INTRAVENOUS
  Filled 2019-04-01: qty 1000

## 2019-04-01 MED ORDER — TERBUTALINE SULFATE 1 MG/ML IJ SOLN: 0.2500 mg | Freq: Once | INTRAMUSCULAR | Status: DC | PRN

## 2019-04-01 MED ORDER — BUPROPION HCL 75 MG PO TABS
75.0000 mg | ORAL_TABLET | Freq: Every day | ORAL | Status: DC
  Administered 2019-04-02 – 2019-04-03 (×2): 75 mg via ORAL
  Filled 2019-04-01 (×3): qty 1

## 2019-04-01 MED ORDER — OXYCODONE-ACETAMINOPHEN 5-325 MG PO TABS: 1.0000 | ORAL_TABLET | ORAL | Status: DC | PRN

## 2019-04-01 MED ORDER — FLUOXETINE HCL 20 MG PO CAPS
20.0000 mg | ORAL_CAPSULE | Freq: Every day | ORAL | Status: DC
  Administered 2019-04-02 – 2019-04-03 (×2): 20 mg via ORAL
  Filled 2019-04-01 (×2): qty 1

## 2019-04-01 MED ORDER — ONDANSETRON HCL 4 MG/2ML IJ SOLN: 4.0000 mg | INTRAMUSCULAR | Status: DC | PRN

## 2019-04-01 MED ORDER — SENNOSIDES-DOCUSATE SODIUM 8.6-50 MG PO TABS
2.0000 | ORAL_TABLET | ORAL | Status: DC
  Administered 2019-04-02 (×2): 2 via ORAL
  Filled 2019-04-01 (×2): qty 2

## 2019-04-01 NOTE — Plan of Care (Signed)
POC and paper work explained to pt

## 2019-04-01 NOTE — Progress Notes (Signed)
Patient ID: Maria Mercado, female   DOB: 06-14-1991, 28 y.o.   MRN: [id number] Pt feeling mild cramping  afeb vss  BP nml after one high reading  FHR category 1  Cervix 50/2/-2 posterior  Membranes stripped per pt request  Will follow progress and AROM if needed

## 2019-04-01 NOTE — Anesthesia Procedure Notes (Signed)
Epidural Patient location during procedure: OB Start time: 04/01/2019 4:05 PM End time: 04/01/2019 4:20 PM  Staffing Anesthesiologist: Freddrick March, MD Performed: anesthesiologist   Preanesthetic Checklist Completed: patient identified, pre-op evaluation, timeout performed, IV checked, risks and benefits discussed and monitors and equipment checked  Epidural Patient position: sitting Prep: site prepped and draped and DuraPrep Patient monitoring: continuous pulse ox, blood pressure, heart rate and cardiac monitor Approach: midline Location: L3-L4 Injection technique: LOR air  Needle:  Needle type: Tuohy  Needle gauge: 17 G Needle length: 9 cm Needle insertion depth: 5 cm Catheter type: closed end flexible Catheter size: 19 Gauge Catheter at skin depth: 11 cm Test dose: negative  Assessment Sensory level: T8 Events: blood not aspirated, injection not painful, no injection resistance, negative IV test and no paresthesia  Additional Notes Patient identified. Risks/Benefits/Options discussed with patient including but not limited to bleeding, infection, nerve damage, paralysis, failed block, incomplete pain control, headache, blood pressure changes, nausea, vomiting, reactions to medication both or allergic, itching and postpartum back pain. Confirmed with bedside nurse the patient's most recent platelet count. Confirmed with patient that they are not currently taking any anticoagulation, have any bleeding history or any family history of bleeding disorders. Patient expressed understanding and wished to proceed. All questions were answered. Sterile technique was used throughout the entire procedure. Please see nursing notes for vital signs. Test dose was given through epidural catheter and negative prior to continuing to dose epidural or start infusion. Warning signs of high block given to the patient including shortness of breath, tingling/numbness in hands, complete motor block,  or any concerning symptoms with instructions to call for help. Patient was given instructions on fall risk and not to get out of bed. All questions and concerns addressed with instructions to call with any issues or inadequate analgesia.  Reason for block:procedure for pain

## 2019-04-01 NOTE — Anesthesia Preprocedure Evaluation (Addendum)
Anesthesia Evaluation  Patient identified by MRN, date of birth, ID band Patient awake    Reviewed: Allergy & Precautions, NPO status , Patient's Chart, lab work & pertinent test results  Airway Mallampati: II  TM Distance: >3 FB Neck ROM: Full    Dental no notable dental hx.    Pulmonary neg pulmonary ROS,    Pulmonary exam normal breath sounds clear to auscultation       Cardiovascular negative cardio ROS Normal cardiovascular exam Rhythm:Regular Rate:Normal     Neuro/Psych PSYCHIATRIC DISORDERS Anxiety Depression negative neurological ROS     GI/Hepatic negative GI ROS, Neg liver ROS,   Endo/Other  negative endocrine ROS  Renal/GU negative Renal ROS  negative genitourinary   Musculoskeletal negative musculoskeletal ROS (+)   Abdominal   Peds  Hematology  (+) Blood dyscrasia (Hgb 9.4), anemia ,   Anesthesia Other Findings   Reproductive/Obstetrics (+) Pregnancy                            Anesthesia Physical Anesthesia Plan  ASA: II  Anesthesia Plan: Epidural   Post-op Pain Management:    Induction:   PONV Risk Score and Plan: Treatment may vary due to age or medical condition  Airway Management Planned: Natural Airway  Additional Equipment:   Intra-op Plan:   Post-operative Plan:   Informed Consent: I have reviewed the patients History and Physical, chart, labs and discussed the procedure including the risks, benefits and alternatives for the proposed anesthesia with the patient or authorized representative who has indicated his/her understanding and acceptance.       Plan Discussed with: Anesthesiologist  Anesthesia Plan Comments: (Patient identified. Risks, benefits, options discussed with patient including but not limited to bleeding, infection, nerve damage, paralysis, failed block, incomplete pain control, headache, blood pressure changes, nausea, vomiting,  reactions to medication, itching, and post partum back pain. Confirmed with bedside nurse the patient's most recent platelet count. Confirmed with the patient that they are not taking any anticoagulation, have any bleeding history or any family history of bleeding disorders. Patient expressed understanding and wishes to proceed. All questions were answered. )       Anesthesia Quick Evaluation

## 2019-04-01 NOTE — Progress Notes (Signed)
Patient ID: Maria Mercado, female   DOB: Oct 07, 1990, 28 y.o.   MRN: [id number] Got uncomfortable and received fentanyl with improvement   afeb VSS FHR category 1 with occasional mild early decels  80/4/-1 slightly posterior  AROM clear Advised pt may get epidural whenever desires

## 2019-04-02 LAB — CBC
HCT: 25.2 % — ABNORMAL LOW (ref 36.0–46.0)
Hemoglobin: 7.4 g/dL — ABNORMAL LOW (ref 12.0–15.0)
MCH: 21.6 pg — ABNORMAL LOW (ref 26.0–34.0)
MCHC: 29.4 g/dL — ABNORMAL LOW (ref 30.0–36.0)
MCV: 73.5 fL — ABNORMAL LOW (ref 80.0–100.0)
Platelets: 203 10*3/uL (ref 150–400)
RBC: 3.43 MIL/uL — ABNORMAL LOW (ref 3.87–5.11)
RDW: 17.2 % — ABNORMAL HIGH (ref 11.5–15.5)
WBC: 14.6 10*3/uL — ABNORMAL HIGH (ref 4.0–10.5)
nRBC: 0.1 % (ref 0.0–0.2)

## 2019-04-02 MED ORDER — PANTOPRAZOLE SODIUM 40 MG PO TBEC
40.0000 mg | DELAYED_RELEASE_TABLET | Freq: Every day | ORAL | Status: DC
  Administered 2019-04-02 – 2019-04-03 (×2): 40 mg via ORAL
  Filled 2019-04-02 (×2): qty 1

## 2019-04-02 NOTE — Lactation Note (Signed)
This note was copied from a baby's chart. Lactation Consultation Note  Patient Name: Maria Mercado [id number] Date: 04/02/2019 Reason for consult: Initial assessment;1st time breastfeeding;Term P2, 6 hour female infant, DAT+, per mom,   as an infant she was jaundice and spent extra days in hospital  Per mom, this is her third time latching infant to breast. Infant had one stool since birth. Per mom, wants help with latching infant on left breast , mom states infant latches well on right breast without difficulties.  LC ask mom hand express a little colostrum out before she latched infant using cross cradle position, LC discussed bring infant to breast chin first, with infant tongue down and mouth open wide. Infant was only opening mouth wide but not suckling at breast. LC had infant suckle on glove finger, mom re-latched infant on breast in cross cradle hold. Infant sustained latch and breastfeed for 15 minutes. Mom will continue working on latching infant  at breast and will ask Nurse or [place] for assistance if needed.  Mom taught back hand expression and infant was given 9 ml of EBM by spoon.  Mom knows she might have to supplement with EBM or formula due infant being DAT+. Mom knows to breast infant according hunger cues, 8 to 12 times within 24 hours and on demand. LC discussed I & O. Mom made aware of O/P services, breastfeeding support groups, community resources, and our phone # for post-discharge questions.  Mom's Plan: 1. Breastfeed according hunger cues, 8 to 12 times within 24 hours. 2. Mom will hand express or use personal medela DEBP and given infant back volume (EBM)after breastfeeding according to infant 's age/ hours of life. 3. Mom will use personal medela DEBP every 3 hours for 15 minutes.. 4. Parents with do as much STS as possible.  Maternal Data Formula Feeding for Exclusion: No Has patient been taught Hand Expression?: Yes(infant given 9 ml of colostrum by  spoon.)  Feeding Feeding Type: Breast Fed  LATCH Score Latch: Repeated attempts needed to sustain latch, nipple held in mouth throughout feeding, stimulation needed to elicit sucking reflex.  Audible Swallowing: A few with stimulation  Type of Nipple: Everted at rest and after stimulation  Comfort (Breast/Nipple): Soft / non-tender  Hold (Positioning): Assistance needed to correctly position infant at breast and maintain latch.  LATCH Score: 7  Interventions Interventions: Hand express;Breast massage;Position options;Support pillows;Skin to skin;Assisted with latch;Adjust position;Breast feeding basics reviewed;Breast compression;DEBP  Lactation Tools Discussed/Used Pump Review: Setup, frequency, and cleaning;Milk Storage Initiated by:: Maria Mercado, IBCLC mom given size 27 mm breast flanges Date initiated:: 04/02/19   Consult Status Consult Status: Follow-up Date: 04/02/19 Follow-up type: In-patient    Maria Mercado 04/02/2019, 3:58 AM

## 2019-04-02 NOTE — Discharge Summary (Signed)
OB Discharge Summary     Patient Name: Maria Mercado DOB: 03-19-1991 MRN: [id number]  Date of admission: 04/01/2019 Delivering MD: Paula Compton   Date of discharge: 04/03/2019  Admitting diagnosis: pregnacy Intrauterine pregnancy: [redacted]w[redacted]d     Secondary diagnosis:  Active Problems:   Post-dates pregnancy   NSVD (normal spontaneous vaginal delivery)  Additional problems: Anemia     Discharge diagnosis: Term Pregnancy Delivered                                                                                                Post partum procedures:none  Augmentation: AROM and Pitocin  Complications: None  Hospital course:  Induction of Labor With Vaginal Delivery   28 y.o. yo [id number] at [redacted]w[redacted]d was admitted to the hospital 04/01/2019 for induction of labor.  Indication for induction: Favorable cervix at term.  Patient had an uncomplicated labor course as follows: Membrane Rupture Time/Date: 3:11 PM ,04/01/2019   Intrapartum Procedures: Episiotomy: None [1]                                         Lacerations:  2nd degree [3]  Patient had delivery of a Viable infant.  Information for the patient's newborn:  Elidia, Bonenfant [[id number]]  Delivery Method: Vaginal, Spontaneous(Filed from Delivery Summary)    04/01/2019  Details of delivery can be found in separate delivery note.  Patient had a routine postpartum course except for anemia with hemoglobin going from 9.3 to 7.4.  She was ambulating well without dizziness and tolerating fine.  Patient is discharged home 04/03/19.  Physical exam  Vitals:   04/02/19 1130 04/02/19 1427 04/02/19 2207 04/03/19 0605  BP: 120/80 114/83 119/69 117/83  Pulse: 88 72 75 77  Resp: 16 18 18 18   Temp: 98.1 F (36.7 C) 98.2 F (36.8 C) 98.4 F (36.9 C) 98 F (36.7 C)  TempSrc: Oral Oral Oral Oral  SpO2: 100% 100%  100%  Weight:      Height:       General: alert and cooperative Lochia: appropriate Uterine Fundus: firm  Labs: Lab  Results  Component Value Date   WBC 14.6 (H) 04/02/2019   HGB 7.4 (L) 04/02/2019   HCT 25.2 (L) 04/02/2019   MCV 73.5 (L) 04/02/2019   PLT 203 04/02/2019   CMP Latest Ref Rng & Units 04/01/2019  Glucose 70 - 99 mg/dL 87  BUN 6 - 20 mg/dL 6  Creatinine 0.44 - 1.00 mg/dL 0.63  Sodium 135 - 145 mmol/L 136  Potassium 3.5 - 5.1 mmol/L 3.5  Chloride 98 - 111 mmol/L 106  CO2 22 - 32 mmol/L 20(L)  Calcium 8.9 - 10.3 mg/dL 9.0  Total Protein 6.5 - 8.1 g/dL 6.3(L)  Total Bilirubin 0.3 - 1.2 mg/dL 0.4  Alkaline Phos 38 - 126 U/L 239(H)  AST 15 - 41 U/L 20  ALT 0 - 44 U/L 12    Discharge instruction: per After Visit Summary and "Baby and Me Booklet".  After visit  meds:  Allergies as of 04/03/2019      Reactions   Keflex [cephalexin] Itching   Has taken Keflex but was okay, except for one generic brand that made her itch.       Medication List    STOP taking these medications   terconazole 0.4 % vaginal cream Commonly known as: TERAZOL 7     TAKE these medications   acetaminophen 325 MG tablet Commonly known as: Tylenol Take 2 tablets (650 mg total) by mouth every 4 (four) hours as needed (for pain scale < 4).   buPROPion 75 MG tablet Commonly known as: WELLBUTRIN Take 75 mg by mouth daily.   calcium carbonate 500 MG chewable tablet Commonly known as: TUMS - dosed in mg elemental calcium Chew 1 tablet by mouth daily.   FLUoxetine 20 MG tablet Commonly known as: PROZAC Take 20 mg by mouth daily.   ibuprofen 600 MG tablet Commonly known as: ADVIL Take 1 tablet (600 mg total) by mouth every 6 (six) hours.   prenatal multivitamin Tabs tablet Take 2 tablets by mouth daily at 12 noon.       Diet: routine diet  Activity: Advance as tolerated. Pelvic rest for 6 weeks.   Outpatient follow up:6 weeks Follow up Appt:No future appointments. Follow up Visit:No follow-ups on file.  Postpartum contraception: Undecided  Newborn Data: Live born female  Birth Weight: 8 lb  12 oz (3969 g) APGAR: 8, 9  Newborn Delivery   Birth date/time: 04/01/2019 21:35:00 Delivery type: Vaginal, Spontaneous      Baby Feeding: Breast Disposition:home with mother   04/03/2019 Oliver PilaKathy W Janeane Cozart, MD

## 2019-04-02 NOTE — Progress Notes (Signed)
Post Partum Day 1 Subjective: no complaints, up ad lib and tolerating PO  Ambulating fine with no dizziness   Objective: Blood pressure 112/65, pulse 86, temperature 97.8 F (36.6 C), temperature source Oral, resp. rate 18, height 5\' 5"  (1.651 m), weight 84.5 kg, last menstrual period 06/12/2018, SpO2 100 %, unknown if currently breastfeeding.  Physical Exam:  General: alert and cooperative Lochia: appropriate Uterine Fundus: firm   Recent Labs    04/01/19 0753 04/02/19 0513  HGB 9.4* 7.4*  HCT 32.0* 25.2*    Assessment/Plan: Plan for discharge tomorrow Tolerating relative anemia well.   LOS: 1 day   Logan Bores 04/02/2019, 10:43 AM

## 2019-04-02 NOTE — Anesthesia Postprocedure Evaluation (Signed)
Anesthesia Post Note  Patient: Maria Mercado  Procedure(s) Performed: AN AD HOC LABOR EPIDURAL     Patient location during evaluation: Mother Baby Anesthesia Type: Epidural Level of consciousness: awake, awake and alert and oriented Pain management: pain level controlled Vital Signs Assessment: post-procedure vital signs reviewed and stable Respiratory status: spontaneous breathing, nonlabored ventilation and respiratory function stable Cardiovascular status: stable Postop Assessment: no headache, no backache, patient able to bend at knees, no apparent nausea or vomiting, adequate PO intake and able to ambulate Anesthetic complications: no    Last Vitals:  Vitals:   04/02/19 0035 04/02/19 0416  BP: 122/88 126/61  Pulse: 85 86  Resp: 17 17  Temp: 37 C 36.9 C  SpO2: 100% 99%    Last Pain:  Vitals:   04/02/19 0420  TempSrc:   PainSc: 0-No pain   Pain Goal:                   Noreen Mackintosh

## 2019-04-02 NOTE — Progress Notes (Signed)
MOB was referred for history of depression/anxiety. * Referral screened out by Clinical Social Worker because none of the following criteria appear to apply: ~ History of anxiety/depression during this pregnancy, or of post-partum depression following prior delivery. ~ Diagnosis of anxiety and/or depression within last 3 years OR * MOB's symptoms currently being treated with medication and/or therapy. MOB has an active Rx for Prozac and Wellbutrin.   Please contact the Clinical Social Worker if needs arise, by [facility] request, or if MOB scores greater than 9/yes to question 10 on [place] Postpartum Depression Screen.  Laurey Arrow, MSW, LCSW Clinical Social Work [phone]

## 2019-04-03 MED ORDER — IBUPROFEN 600 MG PO TABS: 600.0000 mg | ORAL_TABLET | Freq: Four times a day (QID) | ORAL | 0 refills | Status: DC

## 2019-04-03 MED ORDER — ACETAMINOPHEN 325 MG PO TABS: 650.0000 mg | ORAL_TABLET | ORAL | 1 refills | Status: DC | PRN

## 2019-04-03 NOTE — Lactation Note (Signed)
This note was copied from a baby's chart. Lactation Consultation Note  Patient Name: Maria Mercado [id number] Date: 04/03/2019   Dad had questions about inserting tubing in infant's mouth when supplementing at breast. Dad was given info.   [name] [facility] 04/03/2019, 5:08 PM

## 2019-04-03 NOTE — Progress Notes (Signed)
Post Partum Day 2 Subjective: no complaints, up ad lib and tolerating PO  Objective: Blood pressure 117/83, pulse 77, temperature 98 F (36.7 C), temperature source Oral, resp. rate 18, height 5\' 5"  (1.651 m), weight 84.5 kg, last menstrual period 06/12/2018, SpO2 100 %, unknown if currently breastfeeding.  Physical Exam:  General: alert and cooperative Lochia: appropriate Uterine Fundus: firm   Recent Labs    04/01/19 0753 04/02/19 0513  HGB 9.4* 7.4*  HCT 32.0* 25.2*    Assessment/Plan: Discharge home   LOS: 2 days   Logan Bores 04/03/2019, 10:16 AM

## 2019-04-03 NOTE — Lactation Note (Signed)
This note was copied from a baby's chart. Lactation Consultation Note  Patient Name: Maria Mercado [id number] Date: 04/03/2019   Dad & I supplemented infant at breast. Infant did quite well. I showed Dad how to wash 5Fr tubing & syringe.   Parents will consider this option & let me know if they'd like to continue with this mode of supplementing.   [name] [facility] 04/03/2019, 12:36 PM

## 2019-04-03 NOTE — Lactation Note (Signed)
This note was copied from a baby's chart. Lactation Consultation Note  Patient Name: Maria Mercado [id number] Date: 04/03/2019   Mom called me for assist. Infant was crying, but soon stopped (infant is s/p circ). Infant not interested in taking the breast at this time.   Mom was able to pump about 3 mL. She is interested in supplementing at the breast (including formula). 5Fr/syringe, etc taken into room.   Mom to call me when infant is ready to feed again.    [name] [facility] 04/03/2019, 10:42 AM

## 2019-04-03 NOTE — Lactation Note (Signed)
This note was copied from a baby's chart. Lactation Consultation Note  Patient Name: Boy Aniyla Harling [id number] Date: 04/03/2019   Mom reports that infant has "small little feedings", only suckling for 30 sec - 1 minute. Per Mom, infant did not actively feed from 8am - 6pm yesterday.   Infant was observed at the breast. He went to the breast & latched with ease, but he only does non-nutritive suckling, despite a deep latch. He does not seem to do the movements necessary to transfer any colostrum. 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 There may also be an upper lip tie. Infant may be getting tired at breast or is unable to do tongue movements at breast necessary for milk transfer. At this point, infant will likely need to be supplemented at or away from breast with EBM/formula. Mom's colostrum was spoon- & finger-fed to infant.  I have asked Mom to pump. She has her own DEBP in the room. Infant has left the room for his circ. I have asked Mom call me when she would like for me to return. Mom does not want to use a bottle if supplementation is needed.  Maternal history: Mom's R breast is larger than her L breast. She has a hx of PCOS. Mom felt that her milk supply was adequate with her 1st child. Mom lactated for 6 weeks with her 1st child (that child is now 10 yo). During that time, she triple-fed.  Mom is on Prozac & Wellbutrin 75mg .This is Dad's 1st baby.  [name] [facility] 04/03/2019, 8:55 AM

## 2019-04-04 ENCOUNTER — Ambulatory Visit: Payer: Self-pay

## 2019-04-04 NOTE — Lactation Note (Signed)
This note was copied from a baby's chart. Lactation Consultation Note: [place] arrived in mothers room to see the end of the feeding.  Mother reports that infant took 10.5 ml of ebm with a #5 fr feeding tube.  Mother began to dress infant and infant began cuing again.  Observed mother latching infant on again using the #5 fr feeding tube with ebm/formula.  Infant latched with a shallow latch. Observed bottom lip rolled in .  Assist mother with technique to relax lower jaw and pull lip down for wide gape. Mother pushing syringe to encouraged infant to suckle.  Mother taught to do breast compression. Observed infant taking multiple swallows from the breast without pulling feeding tube.   Discussed importance of a follow up with LC to evaluate infants volume from the breast.  Discussed treatment and prevention of engorgement.  Mother advised to continue to cue base feeding and to allow for cluster feeding.  Staff nurse gave mother comfort gels. Mother reports that she is no longer having discomfort that comfort gels help a lot. Mo there was fit with the right size of flanges.   Mother is aware of available [place] services and community support.   Patient Name: Boy Niamh Rada [id number] Date: 04/04/2019 Reason for consult: Follow-up assessment   Maternal Data    Feeding Feeding Type: Breast Fed  LATCH Score Latch: Grasps breast easily, tongue down, lips flanged, rhythmical sucking.  Audible Swallowing: Spontaneous and intermittent  Type of Nipple: Everted at rest and after stimulation  Comfort (Breast/Nipple): Soft / non-tender  Hold (Positioning): Assistance needed to correctly position infant at breast and maintain latch.(adjust infants lower jaw for wider gape.)  LATCH Score: 9  Interventions Interventions: Assisted with latch;Breast compression;Comfort gels;DEBP  Lactation Tools Discussed/Used Tools: 65F feeding tube / Syringe   Consult Status Consult Status:  Complete(recommend fup with LC)    Darla Lesches 04/04/2019, 9:15 AM

## 2019-05-14 ENCOUNTER — Ambulatory Visit: Payer: Self-pay

## 2019-05-14 NOTE — Lactation Note (Signed)
This note was copied from a baby's chart. Lactation Consultation Note  Patient Name: Maria Mercado [id number] Date: 05/14/2019 Reason for consult: Follow-up assessment;Infant weight loss;Term;Other (Comment)(FTT)  Visited with mom of a 6 weeks old FT female, baby was diagnosed with FTT, he still hasn't recovered birth weight. Peds RN called LC because mom needed assistance with sizing her flanges, mom told LC she only pumped for about 1 week after her baby was born because the flanges she was using were uncomfortable, especially on her right breast. LC noticed that mom was using # 27 flanges but size # 24 seemed to be a more appropriate fit. LC also noted that the junctures on her pump were loose and adjusted them, let mom know that she'll feel more suction the next time she pumps, mom very thankful, she didn't know why the hospital pump didn't have enough suction.  Baby asleep and doing STS with mom when entering the room, baby currently on enteric precautions. Per mom she's been exclusively BF this whole time except for 2 ounces of formula that he was given at the hospital. Mom's plan is to continue breastfeeding and to start supplementing with her EBM after feedings, she wishes not to do formula unless is medically necessary. Talked to mom about discussing with her pediatrician fortifying her EBM with formula, mom has 90 ounces frozen at home, plus what she's going to pump here at the hospital.  Reviewed normal infant behavior at 6 weeks, cluster feeding, feeding patterns and pumping schedule. FOB will be bringing coconut oil for mom to use but she understands that her colostrum will be the # 1 remedy against sore nipples.   Feeding plan:  1. Encouraged mom to keep putting baby to the breast STS on cues at least 8 times/24 hours or sooner if feeding cues are present but to limit those feedings to no more than 30 minutes/time 2. She'll pump after feedings and will offer at least 2 oz of EBM per  feeding or more if baby desires 3. She'll use coconut oil prior pumping and will try # 24 flanges  BF brochure given, mom reported all questions and concerns were answered, she's aware of BF support group and OP LC consultation and will call PRN.  Maternal Data    Feeding Feeding Type: Bottle Fed - Breast Milk   Interventions Interventions: Breast feeding basics reviewed;DEBP  Lactation Tools Discussed/Used Tools: Pump;Flanges Flange Size: 24 Breast pump type: Double-Electric Breast Pump Pump Review: Setup, frequency, and cleaning Initiated by:: Peds RN and MPeck (adjusted junctures on tubing) Date initiated:: 05/14/19   Consult Status Consult Status: PRN Follow-up type: In-patient    Maria Mercado Maria Mercado 05/14/2019, 8:49 PM

## 2019-05-15 ENCOUNTER — Ambulatory Visit: Payer: Self-pay

## 2019-05-15 NOTE — Lactation Note (Signed)
This note was copied from a baby's chart. Lactation Consultation Note  Patient Name: Maria Mercado [id number] Date: 05/15/2019 Reason for consult: Follow-up assessment;Mother's request;Other (Comment);Term;Infant weight loss(FTT)  Peds called for another consult to check on baby's status. He's finally gaining weight, MD has ordered to stop feedings at the breast for the next 48 hours and just do pump and bottle. Mom was told to use the frozen milk from home first, and she's been freezing the fresh batches she's been pumping at the hospital. Spoke to RN and let her know that it's best for baby to use the freshest batch first due to his current history, baby is still on enteric precautions, he was admitted for diahrrea. Educated mom how her body is now producing specific antibodies on her breastmilk to help fight baby's infection, she voiced understanding.   Baby getting fuzzy when entering the room, mom called for a frozen batch since she can't put baby to breast, and kept waiting for it. Mom gave baby a pacifier and baby started sucking vigorously. LC discussed with mom how pacifiers can hide feeding cues and engage baby in non-nutritive sucking. LC fixed a bottle with fresh breastmilk for baby since he started crying and mom gave it to him, he took the entire 2.5 ounces. Mom burped him in between and very time she'd burp baby protested but would stop crying as soon as he was given again the bottle with breastmilk until it was empty. He showed signs of fullness and didn't take a second bottle but advised mom to watch for hunger cues and offer it to him the next time he asks for it.  Baby was awake today and looking much more alert in comparison to yesterday, he was sleepy and sluggish and difficult to arouse. He was fuzzy but then content and in his quiet alert state after his feeding. He engaged in some social interaction with LC, he was happy and smiling. Per mom he's been taking between 2.5 to 3  oz per feeding and he's been feeding every 3 hours. Mom also reported that the # 24 flanges are working a lot better for her along with coconut oil.   Feeding plan:  1. Encouraged mom to continue with feeding plan outlined by baby's pediatrician, but start offering the freshest batch of EBM and use the frozen ones once she runs out. She won't be putting baby to the breast until MD OK's her 2. She'll continue offering at least 2.5 oz of EBM per feeding or more if baby desires 3. She'll pump every 3 hours, at least 8 pumping sessions in 24 hours  Mom reported all questions and concerns were answered, she's aware of BF support group and OP LC consultation and will call PRN. She's expecting a discharge on Tuesday and would like to be seen by lactation prior her discharge.   Maternal Data    Feeding Feeding Type: Breast Milk Nipple Type: Slow - flow  LATCH Score                   Interventions Interventions: Breast feeding basics reviewed;Expressed milk  Lactation Tools Discussed/Used     Consult Status Consult Status: Follow-up Date: 05/17/19 Follow-up type: In-patient    Gurbani Figge [name] 05/15/2019, 5:18 PM

## 2019-05-16 ENCOUNTER — Ambulatory Visit: Payer: Self-pay

## 2019-05-16 NOTE — Lactation Note (Signed)
This note was copied from a baby's chart. Lactation Consultation Note  Patient Name: Maria Mercado [id number]'s Date: 05/16/2019 Reason for consult: Follow-up assessment;MD order;Other (Comment)(baby was readmitted , behind in weight , now gaining )   [facility]  Latch.  As LC entered the room mom mentioned just in time he is due to feed.  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  1st breast left - mom 1st latch in the laid back position and LC noted the baby's mouth to be very disorganized and was on and off when he was  Getting fussy.  LC recommended lying down and feeding and baby was still disorganized  And antz but not has much , and more swallows noted. Fed for 15 mins,  And took 20 ml off the breast.  Post - weight and re- latch on the right breast with a #24 NS ( good size ) and quite the difference in latch and baby was much more organized and fed 20 mins and sustained latch , milk in the NS when finished. Baby transferred 15 ml  But more organized.  After feeding at the breast / LC had mom feed with the Nuk nipple she brought from home and baby fed poorly , no consistent rhythmic pattern noted. LC switched mom back to the hospital slow flow yellow nipple and baby sucked down 2 oz of EBM and was satisfied.   Pre- weight - 4125kg - post weight 4145 kg - left breast - 20 ml total  Pre-weight - 4145 kg - post weight 4160 Kg - right breast 15 ml  Total removed from the breast was 35 ml.  Total for the entire feeding was 90 ml  LC suspects moms milk supply for 6 weeks is low she is pumping off  Max 2- 1/2 oz per pumping. 6 weeks out from birth should be 3-4 oz per breast.  LC also suspects due to the tongue - tie issue baby had for 4 weeks prior to the revision became very non - nutritive with feedings and spent time hanging out latched .  [facility] feels [facility] be close f/U with LC  [facility]  Work on Research scientist (physical sciences)building milk supply.  Work on latching with the #24 NS for 15 -20  mins - 30 mins max and supplement for now with the yellow nipple and when mom goes home  She can increase to a Dr. Manson PasseyBrown medium base PACE Feeding and making sure is lips are flanged.  Post pumping both breast for 10 - 15 mins , save milk for the next feeding .  LC placed a request for an LC O/P this week by Thursday .  Per mom her OB MD gave her a prescription for Reglan for milk supply increase 3x's a day.  LC also recommended Fenugreek ( mom has no allergies ) or Mother Love take as directed . Herbs to aid in increasing milk supply.       Maternal Data Has patient been taught Hand Expression?: Yes Does the patient have breastfeeding experience prior to this delivery?: Yes(per mom with challenges )  Feeding Feeding Type: Bottle Fed - Breast Milk Nipple Type: Slow - flow(tried mom nipple from home - baby did poorly, yellow nipple )  LATCH Score Latch: Repeated attempts needed to sustain latch, nipple held in mouth throughout feeding, stimulation  needed to elicit sucking reflex.  Audible Swallowing: Spontaneous and intermittent  Type of Nipple: Everted at rest and after stimulation  Comfort (Breast/Nipple): Filling, red/small blisters or bruises, mild/mod discomfort  Hold (Positioning): No assistance needed to correctly position infant at breast.  LATCH Score: 8  Interventions Interventions: Breast feeding basics reviewed  Lactation Tools Discussed/Used Tools: Nipple Shields Nipple shield size: 24(sized by the LC ) Pump Review: Setup, frequency, and cleaning Initiated by:: (already set up )   Consult Status Consult Status: PRN Follow-up type: In-patient    [place] 05/16/2019, 7:32 PM

## 2019-05-19 ENCOUNTER — Ambulatory Visit: Payer: Self-pay

## 2019-05-19 NOTE — Lactation Note (Signed)
This note was copied from a baby's chart. Lactation Consultation Note  Patient Name: Maria Mercado Date: 05/19/2019     05/19/2019  Name: Maria Mercado MRN: [id number] Date of Birth: 04/01/2017 Gestational Age: Gestational Age: [redacted]w[redacted]d Birth Weight: 140 oz Weight today:    9 pounds 10.3 ounces (4374 grams) with clean newborn diaper   46 week old term infant presents today with mom for feeding assessment. Infant post admission to Peds for FTT. Mom has not been BF infant since she left the hospital with him yesterday. Infant was gaining slowly from the beginning and did a weighted feed at 2 weeks and infant transferred 3 ounces. He was not weighed again until this week. Mom reports she was not able to successfully BF her first child. She also had difficulty with transferring and maintaining a milk supply.   Infant has gained 179 grams in the last 2 days with an average daily weight gain of 90 grams a day.   Infant had Laser tongue and lip releases 3 weeks ago by Dr. Enis Gash at [facility].   Mom reports infant would bob on and off the breast. He was not sucking well and needed breast compressions. Infant was seemingly full bot not gaining weight. 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 Mom is still performing stretches per Dr. Alta Corning. Infant tongue and lip have healed. Infant to see Dr. Alta Corning next week again.   Mom reports she went to the ped as he had 2 diarrhea stools and his fontanelle was sunken in and was admitted to peds as was below birth weight. Pt was fed with mom's breast milk.   Mom is pumping about every feeding (every 2-3 hours) and is getting 3-4 ounces per pumping. Mom was using the Haakaa at about 26-17 weeks old to collect letdown, mom was storing that milk. Mom has milk in the freezer still. Mom does have a good milk supply.   Infant has to be awakened to feed for every feeding. Infant is being bottle fed every 2-3  hours and is taking about 3 ounces per feeding. Infant will vomit if he is fed more. Infant is using the Avent , NanoBebe, and hospital nipple. Mom is pace bottle feeding infant and he takes his bottles in about 10 minutes. Infant took 45 minutes to Nuk and the Evenflow nipple.   Mom was using the SNS in the hospital and infant would push tube out of his mouth. Mom then went to breast feeding and post bottle feeding.  Infant latched to both breasts. Infant very sleepy with feeding and did not transfer well. Discussed with mom that infant is still very sleepy and should improve with increasing weight and energy. Mom with no pain with feeding, nipples slightly compressed post feeding.   Infant lip is flangable and stretches well. It still has a small area that is in the healing process. Posterior lingual frenulum noted, discussed with mom that she needs to follow up with Dr. Alta Corning for follow up in regards to tongue tie.   Reviewed with mom that I would recommend that she limit BF to 2-3 sessions per day and to limit BF to 20 minutes until he is more efficient at the breast.  Mom voiced understanding.   Infant to follow up Dr. Alta Corning on 9/17. Infant to follow up with Dr. Cardell Peach on 9/16. Infant to follow up with Lactation in 2 weeks.  General Information: Mother's reason for visit: Feeding assessment, FTT Consult: Initial Lactation consultant: Noralee StainSharon Aydon Swamy RN,IBCLC Breastfeeding experience: not been latching since leaving peds Maternal medical conditions: Polycystic ovarian syndrome, Infertility(Depression, meds for infertility x 6 months and then pregnant spontaneously) Maternal medications: Pre-natal vitamin(Wellbutrin 75 mg, Prozac 20 mg QD, Reglan 10 mg TID)  Breastfeeding History: Frequency of breast feeding: not been latching since left peds    Supplementation: Supplement method: bottle(Avent)         Breast milk volume: 3 ounces Breast milk frequency: every 3 hours   Pump type:  Medela pump in style(Spectra S2) Pump frequency: every 2-3 hours Pump volume: 3-4 ounces and using Haakaa  Infant Output Assessment: Voids per 24 hours: 8+ Urine color: Clear yellow Stools per 24 hours: 4-5 Stool color: Yellow  Breast Assessment: Breast: Soft, Compressible Nipple: Erect Pain level: 0 Pain interventions: Bra, Breast pump  Feeding Assessment: Infant oral assessment: Variance Infant oral assessment comment: see note Positioning: Cross cradle(left breast, 15 minutes)   Audible swallowing: 1 - A few with stimulation Type of nipple: 2 - Everted at rest and after stimulation Comfort: 2 - Soft/non-tender Hold: 2 - No assistance needed to correctly position infant at breast LATCH score: 7 Latch assessment: Deep   Suck assessment: Displays both   Pre-feed weight: 4374 grams Post feed weight: 4396 grams Amount transferred: 22 ml Amount supplemented: 0  Additional Feeding Assessment: Infant oral assessment: Variance Infant oral assessment comment: see note Positioning: Cross cradle(right breast, 5 minutes) Latch: 1 - Repeated attempts neede to sustain latch, nipple held in mouth throughout feeding, stimulation needed to elicit sucking reflex. Audible swallowing: 1 - A few with stimulation Type of nipple: 2 - Everted at rest and after stimulation Comfort: 2 - Soft/non-tender Hold: 2 - No assistance needed to correctly position infant at breast LATCH score: 8 Latch assessment: Deep Lips flanged: Yes Suck assessment: Displays both   Pre-feed weight: 4396 grams Post feed weight: 4412 grams Amount transferred: 16 ml Amount supplemented: 75 ml EBM via bottle  Totals: Total amount transferred: 38 ml Total supplement given: 75 ml EBM via bottle Total amount pumped post feed: did not pump   Plan:  1. Offer the breast with feeding cues about 2-3 x a day Limit breast feeding to 20 minutes if sleepy at the breast Make sure infant gets at least 8 feedings in 24  hours, more if he wants it 2. Keep infant awake at the breast with feeding 3. Massage/compress breast with feeding 4. Empty the first breast before offering the second breast 5. Offer infant a bottle of breast milk after the breast feeding, feed him until he is satisfied at least 2 ounces 6. Infant needs about 81-108 (3-3.5 ounces) for 8 feedings a day or 645-860 ml (22-29 ounces) in 24 hours. Infant may take more or less depending on how often he feeds. Feed infant until he is satisfied.  7. Continue to feed infant using the paced bottle feeding method 8. Would recommend that you keep pumping with each feeding to protect milk supply until infant is feeding more consistently. Use your double electric when pumping. Using your hands free bra with pumping and massage the breast with pumping.  9. Keep up the good work 10. Thank you for allowing me to assist you today 11. Please call with any questions/concerns as needed [phone]) [phone] 12. Follow up with Lactation in 2 weeks   Ed BlalockSharon S Emillio Ngo RN, [employer]CLC  Debby Freiberg Mike Hamre 05/19/2019, 3:31 PM

## 2019-05-31 ENCOUNTER — Ambulatory Visit: Payer: Self-pay

## 2019-05-31 NOTE — Lactation Note (Addendum)
This note was copied from a baby's chart. Lactation Consultation Note  Patient Name: Maria Mercado [id number]'M Date: 05/31/2019     05/31/2019  Name: Maria Mercado MRN: [id number] Date of Birth: 04/01/2017 Gestational Age: Gestational Age: [redacted]w[redacted]d Birth Weight: 140 oz Weight today:    10 pounds 6.9 ounces (4732 grams) with clean size 50 diaper  3 week old infant presents today with mom for follow up feeding assessment. Infant has been dealing with diarrhea and vomiting last week.   Infant has gained 3578 grams in the last 12 days with an average daily weight gain of 30 grams a day.   Infant is feeding about every 2 hours and going to the breast 2-3 x a day and getting a bottle otherwise. Infant will take 3 ounces even after breast feeding. Mom is waking infant every 3 hours at night to feed infant. If infant sleeps for 4 hours at night he will take more volume or cluster feed after the long sleeping.   Mom is pumping with each feeding and is getting enough volume to feed infant and store some milk in the freezer.   Infant will take a bottle of 3 ounces, infant will spit if he takes more volume at one time. Discussed increasing volumes slowly (1/2 ounce per feeding) as infant wants to then allow him to take longer stretches at night.   Infant latched to both breasts and fed for a good amount of time for the amount of milk he was able to get at one time. Discussed suck training with mom to try and see if it helps with suck training. Handout given.   Infant with partially re-adhered. Upper lip stretches pretty well. Infant bites on the breast and finger with some tongue thrusting noted. Infant with posterior lingual frenulum noted, infant with decreased mid tongue elevation noted. Infant with strong suckle on gloved finger. Infant with high palate. Infant pulls on and off the breast at times with feeding. Infant chomps on the breast some per mom. Infant is still needing stimulation  to stay awake at the breast. Infant clicking on the bottle with feeding. Infant with cheek dimpling with bottle feeding and occasionally with breast feeding. Discussed with mom that it can take some infants over 4 weeks to see the improvement after tongue and lip releases.   Dr Alta Corning reevaluated pt and noted that pt was ok with his tongue and lip releases. He recommended that mom continue working with lactation.   Infant to follow up with Dr. Cardell Peach at 34 months of age. Infant to follow up with Lactation in 2 weeks.   Mom to call with questions/concerns as needed.    General Information: Mother's reason for visit: Follow up feeding assessment Consult: Follow-up Lactation consultant: Noralee Stain RN,IBCLC Breastfeeding experience: latching 2-3 x a day and then supplementing Maternal medical conditions: Polycystic ovarian syndrome, Infertility, History post partum depression Maternal medications: Pre-natal vitamin(Wellbutrin 75 mg, Prozac 20 mg)  Breastfeeding History: Frequency of breast feeding: 3 x a day Duration of feeding: 20 minutes  Supplementation: Supplement method: bottle(Avent, NUK, drools some on the bottle at the beginning of the feeding.)         Breast milk volume: 3 ounces Breast milk frequency: 1.5-3 ounces Total breast milk volume per day: 24 ounces Pump type: Spectra(Medela PIS) Pump frequency: every 2-3 hours Pump volume: 4-5 ounces  Infant Output Assessment: Voids per 24 hours: 8+ Urine color: Clear yellow Stools per 24 hours: 2-4 ounces Stool  color: Yellow  Breast Assessment: Breast: Soft, Compressible Nipple: Erect Pain level: 1 Pain interventions: Bra, Breast pump  Feeding Assessment: Infant oral assessment: Variance Infant oral assessment comment: see note Positioning: Cradle(right breast, 10 minutes) Latch: 2 - Grasps breast easily, tongue down, lips flanged, rhythmical sucking. Audible swallowing: 2 - Spontaneous and intermittent Type of nipple:  2 - Everted at rest and after stimulation Comfort: 1 - Filling, red/small blisters or bruises, mild/mod discomfort Hold: 2 - No assistance needed to correctly position infant at breast LATCH score: 9 Latch assessment: Deep Lips flanged: Yes Suck assessment: Displays both   Pre-feed weight: 4732 grams Post feed weight: 4756 grams Amount transferred: 24 ml Amount supplemented: 0  Additional Feeding Assessment: Infant oral assessment: Variance Infant oral assessment comment: see note Positioning: Cradle(left breast, 15 minutes) Latch: 2 - Grasps breast easily, tongue down, lips flanged, rhythmical sucking. Audible swallowing: 2 - Spontaneous and intermittent Type of nipple: 2 - Everted at rest and after stimulation Comfort: 1 - Filling, red/small blisters or bruises, mild/mod discomfort Hold: 2 - No assistance needed to correctly position infant at breast LATCH score: 9 Latch assessment: Deep Lips flanged: Yes Suck assessment: Displays both   Pre-feed weight: 4756 grams Post feed weight: 4776 grams Amount transferred: 20 ml    Totals: Total amount transferred: 44 ml Total supplement given: 90 ml EBM via bottle Total amount pumped post feed: did not pump   Plan:   1. Offer the breast with feeding cues about 2-3 x a day Limit breast feeding to 20 minutes if sleepy at the breast Make sure infant gets at least 8 feedings in 24 hours, more if he wants it Infant can take 1-2 four hour stretches at night as long as infant is getting at least 24 ounces a day.  2. Keep infant awake at the breast with feeding 3. Massage/compress breast with feeding 4. Empty the first breast before offering the second breast 5. Offer infant a bottle of breast milk after the breast feeding, feed him until he is satisfied at least 2 ounces 6. Infant needs about 88-118  (3-4 ounces) for 8 feedings a day or 705-940 ml (24-31 ounces) in 24 hours. Infant may take more or less depending on how often he  feeds. Feed infant until he is satisfied.  7. Continue to feed infant using the paced bottle feeding method 8. Would recommend that you keep pumping with each feeding to protect milk supply until infant is feeding more consistently. Use your double electric when pumping. Using your hands free bra with pumping and massage the breast with pumping.  9. Continue stretches per Dr. Delsa Sale 10. Suck training 5-6 x a day for 1-2 minutes per exercise until infant suckle improves.  11. Keep up the good work 56. Thank you for allowing me to assist you today 13. Please call with any questions/concerns as needed [phone] 14. Follow up with Lactation in 2 weeks  [place], IBCLC                                                    Donn Pierini 05/31/2019, 9:21 AM

## 2019-06-14 ENCOUNTER — Ambulatory Visit: Payer: Self-pay

## 2019-06-14 NOTE — Lactation Note (Signed)
This note was copied from a baby's chart. Lactation Consultation Note  Patient Name: Maria Mercado [id number] Date: 06/14/2019     06/14/2019  Name: Maria Mercado MRN: [id number] Date of Birth: 04/01/2017 Gestational Age: Gestational Age: [redacted]w[redacted]d Birth Weight: 140 oz Weight today:    11 pounds 3.7 ounces (5094 grams) with clean size 74 diaper  59 month old term infant presents today with mom for follow up feeding assessment. Infant post tongue and lip releases at 16 weeks of age.   Infant has gained 362 grams in the last 14 days with an average daily weight gain of 26 grams a day. Mom reports infant weighed 11 pounds 3 ounces on Thursday.   Reviewed increasing volumes over day and see if she can get infant to take at least 25 ounces a day. Infant has difficulty with spitting if he takes 4 ounces at a time.   Mom reports BF has been going ok. He is mostly bottle feeding. Mom is BF infant 1-2 x a day when infant mostly awake. Infant is pulling on and off the breast and is biting at times. Mom reports it is easier to bottle feed infant as she is also busy with 47 yo and school work. She reports she is not enjoying pumping.   Infant with partially re-adhered. Upper lip stretches pretty well. Infant bites on the breast and finger with some tongue thrusting noted. Infant with posterior lingual frenulum noted, infant with decreased mid tongue elevation noted. Infant with strong suckle on gloved finger. Infant with high palate. Infant pulls on and off the breast at times with feeding. Infant chomps on the breast some per mom. Infant is more awake at the breast, although mom latches at times infant is more alert. Infant with cheek dimpling with breast feeding. Infant is still spitting a lot per mom with each feeding. Infant spit with a burp at the end of the feeding. Mom has continue his stretches. Mom is performing suck training, infant does better on the bottle vs the finger.   Infant more  active at the breast today and transferred milk much better. Mom pleased that infant improved today. Discussed with mom to watch infant feeding cues and to offer a bottle after BF anytime he is cueing to feed. Enc to monitor breast softening with feeding also. Mom to increase BF during the day and then continue to pump and bottle feed at night until infant is showing more consistency with latching and transferring at the breast.   infant to follow up with Dr. Abner Greenspan at 46 months old. Infant to follow up with Lactation in 2 weeks to assess weight gain and continued BF. Marland Kitchen     General Information: Mother's reason for visit: Follow up feeding assessment Consult: Follow-up Lactation consultant: Nonah Mattes RN,IBCLC Breastfeeding experience: Latching 1-2 x a day and then bottle feeding otherwise Maternal medical conditions: Polycystic ovarian syndrome, Infertility, History post partum depression Maternal medications: Pre-natal vitamin, Other(Prozac, Wellbutrin)  Breastfeeding History: Frequency of breast feeding: 1-2 x a day Duration of feeding: 20 minutes  Supplementation: Supplement method: bottle(Nuk, Avent and Tommie Tippee, infant still drooling on the bottle with feedings)         Breast milk volume: 3.5 ounces Breast milk frequency: 7-8 x a day Total breast milk volume per day: 21-24 ounces Pump type: Spectra(Medela PIS also) Pump frequency: 7-8 x a day Pump volume: 3-4.5 ounces, usually has enough to feed infant, has milk in the fridge and freezer  Infant Output Assessment: Voids per 24 hours: 8+ Urine color: Clear yellow Stools per 24 hours: 7-8, diarrhea, being testing for allergies Stool color: Yellow  Breast Assessment: Breast: Soft, Compressible Nipple: Erect Pain level: 0(when biting, it is painful) Pain interventions: Bra, Breast pump  Feeding Assessment: Infant oral assessment: Variance(right breast, 20 minutes) Infant oral assessment comment: see  note Positioning: Cradle(right breast, 15 minutes) Latch: 1 - Repeated attempts needed to sustain latch, nipple held in mouth throughout feeding, stimulation needed to elicit sucking reflex. Audible swallowing: 2 - Spontaneous and intermittent Type of nipple: 2 - Everted at rest and after stimulation Comfort: 2 - Soft/non-tender Hold: 2 - No assistance needed to correctly position infant at breast LATCH score: 9 Latch assessment: Shallow Lips flanged: Yes Suck assessment: Displays both   Pre-feed weight: 5094 grams Post feed weight: 5162 grams Amount transferred: 68 ml Amount supplemented: 0  Additional Feeding Assessment: Infant oral assessment: Variance Infant oral assessment comment: see note Positioning: Cradle(left breast,) Latch: 2 - Grasps breast easily, tongue down, lips flanged, rhythmical sucking. Audible swallowing: 2 - Spontaneous and intermittent Type of nipple: 2 - Everted at rest and after stimulation Comfort: 2 - Soft/non-tender Hold: 2 - No assistance needed to correctly position infant at breast LATCH score: 10 Latch assessment: Deep Lips flanged: Yes Suck assessment: Displays both   Pre-feed weight: 5162 grams Post feed weight: 5200 grams Amount transferred: 38 ml Amount supplemented: 0  Totals: Total amount transferred: 106 ml Total supplement given: 0 Total amount pumped post feed: did not pump  1.Offer the breast with feeding cues about 3-4 x a day Limit breast feeding to 20 minutes if sleepy at the breast, if he is active, allow him to nurse longer.  Make sure infant gets at least 8 feedings in 24 hours, more if he wants it Infant can take 1-2 four hour stretches at night as long as infant is getting at least 24 ounces a day.  2. Keep infant awake at the breast with feeding 3. Massage/compress breast with feeding 4. Empty the first breast before offering the second breast 5. Offer infant a bottle of breast milk after the breast feeding, feed him  until he is satisfied at least 2 ounces 6. Infant needs about 94-125  (3-4 ounces) for 8 feedings a day or [phone] ml (25+ ounces) in 24 hours. Infant may take more or less depending on how often he feeds. Feed infant until he is satisfied.  7. Continue to feed infant using the paced bottle feeding method 8. Would recommend that you keep pumping with each feeding to protect milk supply until infant is feeding more consistently. Use your double electric when pumping. Using your hands free bra with pumping and massage the breast with pumping.  9. Continue stretches per Dr. Alta Corning 10. Suck training 5-6 x a day for 1-2 minutes per exercise until infant suckle improves.  11. Keep up the good work 12. Thank you for allowing me to assist you today 13. Please call with any questions/concerns as needed [phone] 14. Follow up with Lactation in 2 weeks Ed Blalock RN, IBCLC                                                     Maria Mercado 06/14/2019, 9:40 AM

## 2019-06-21 ENCOUNTER — Telehealth (INDEPENDENT_AMBULATORY_CARE_PROVIDER_SITE_OTHER): Admitting: Lactation Services

## 2019-06-21 DIAGNOSIS — N61 Mastitis without abscess: Secondary | ICD-10-CM

## 2019-06-21 NOTE — Telephone Encounter (Signed)
Pt reports she went to [place] and infant had times when he would not latch. Mom did pump and then infant had nursing strike x 3 feeds on Saturday. Infant then again started latching.   Pt reports on Sunday she had pain under the breast and went to Urgent Care and started ATB. Pt is doing the [facility] after infant nurses. She is using warm water and massaging the area.   Pt reports she notes a hardened area on the right breast of the right breast. The area is slightly reddened. She did not have fever or flu like symptoms. Pt has tried Ibuprofen and it helps with the pain and then increases with infant nursing. Pt is experiencing pain to nipple area when infant is nursing. Pt has not paid attention to whether she has a milk bleb on the end of the nipple. Mom with a lot of pain with feeding and pumping but is aware that she needs to empty the breast frequently  Reviewed plugged ducts and care and Mastitis care and treatment.   Advised mom to :  Empty the effected breast every 2-3 hours with infant or pump Aim infant chin or nose towards effected area Warm compresses prior to feeding Massage breast with feeding/pumping Hand expression after feeding or pumping Sunflower Lecithin 1200 mg QID Ibuprofen 400-600 mg every 6 hours Vibrator or electric tooth brush over area before feeding   Pt to follow up with Lactation in about a week. She is to call OB if she is not improving.

## 2019-06-24 ENCOUNTER — Other Ambulatory Visit: Payer: Self-pay

## 2019-06-24 ENCOUNTER — Encounter (HOSPITAL_COMMUNITY): Payer: Self-pay

## 2019-06-24 ENCOUNTER — Ambulatory Visit (HOSPITAL_COMMUNITY)
Admission: EM | Admit: 2019-06-24 | Discharge: 2019-06-24 | Disposition: A | Discharge status: Home / Self Care | Attending: Family Medicine | Admitting: Family Medicine

## 2019-06-24 DIAGNOSIS — N61 Mastitis without abscess: Secondary | ICD-10-CM | POA: Diagnosis not present

## 2019-06-24 MED ORDER — SULFAMETHOXAZOLE-TRIMETHOPRIM 800-160 MG PO TABS: 1.0000 | ORAL_TABLET | Freq: Two times a day (BID) | ORAL | 1 refills | Status: AC

## 2019-06-24 NOTE — ED Triage Notes (Signed)
Pt presents to UC w/ c/o mastitis x5 days. Pt states she has been on an antibiotic for it x4 days. Pt states pain has gotten worse and more widespread.

## 2019-06-24 NOTE — Discharge Instructions (Signed)
Stop amoxicillin Start Septra twice a day Continue warm compresses and frequent nursing Call your doctor if not better in a couple days

## 2019-06-24 NOTE — ED Provider Notes (Signed)
[facility]    CSN: [id number] Arrival date & time: 06/24/19  1957      History   Chief Complaint Chief Complaint  Patient presents with   mastitis    HPI Maria Mercado is a 28 y.o. female.   HPI  Patient has mastitis.  She has been on amoxicillin since the 13th.  Her mastitis is growing.  She is having increased pain and swelling.  No fever.  She is using heat, massage, frequent nursing.  Otherwise doing well.  Baby is growing.  He is 2 and half months old  Past Medical History:  Diagnosis Date   Anxiety    Back pain    Depression    History of kidney stones    HS (hereditary spherocytosis) (HCC)     Patient Active Problem List   Diagnosis Date Noted   Post-dates pregnancy 04/01/2019   NSVD (normal spontaneous vaginal delivery) 04/01/2019    Past Surgical History:  Procedure Laterality Date   CYST EXCISION     breast and axial    WISDOM TOOTH EXTRACTION      OB History    Gravida  3   Para  2   Term  2   Preterm      AB  1   Living  2     SAB  1   TAB      Ectopic      Multiple  0   Live Births  2            Home Medications    Prior to Admission medications   Medication Sig Start Date End Date Taking? Authorizing Provider  acetaminophen (TYLENOL) 325 MG tablet Take 2 tablets (650 mg total) by mouth every 4 (four) hours as needed (for pain scale < 4). 04/03/19   Huel Cote, MD  buPROPion (WELLBUTRIN) 75 MG tablet Take 75 mg by mouth daily.     [provider]  FLUoxetine (PROZAC) 20 MG tablet Take 20 mg by mouth daily.     [provider]  ibuprofen (ADVIL) 600 MG tablet Take 1 tablet (600 mg total) by mouth every 6 (six) hours. 04/03/19   Huel Cote, MD  Prenatal Vit-Fe Fumarate-FA (PRENATAL MULTIVITAMIN) TABS tablet Take 2 tablets by mouth daily at 12 noon.    [provider]  sulfamethoxazole-trimethoprim (BACTRIM DS) 800-160 MG tablet Take 1 tablet by mouth 2 (two)  times daily for 7 days. 06/24/19 07/01/19  Eustace Moore, MD    Family History Family History  Problem Relation Age of Onset   Hypertension Mother    Hypertension Father    Stroke Maternal Grandfather    Heart attack Maternal Grandfather     Social History Social History   Tobacco Use   Smoking status: Never Smoker   Smokeless tobacco: Never Used  Substance Use Topics   Alcohol use: Not Currently    Comment: occ   Drug use: Never     Allergies   Keflex [cephalexin]   Review of Systems Review of Systems  Constitutional: Negative for chills and fever.  HENT: Negative for ear pain and sore throat.   Eyes: Negative for pain and visual disturbance.  Respiratory: Negative for cough and shortness of breath.   Cardiovascular: Negative for chest pain and palpitations.  Gastrointestinal: Negative for abdominal pain and vomiting.  Genitourinary: Negative for dysuria and hematuria.  Musculoskeletal: Negative for arthralgias and back pain.  Skin: Positive for color change. Negative for  rash.       Breast pain  Neurological: Negative for seizures and syncope.  All other systems reviewed and are negative.    Physical Exam Triage Vital Signs ED Triage Vitals  Enc Vitals Group     BP 06/24/19 2009 115/77     Pulse Rate 06/24/19 2009 (!) 122     Resp 06/24/19 2009 16     Temp 06/24/19 2009 98.6 F (37 C)     Temp Source 06/24/19 2009 Oral     SpO2 06/24/19 2009 97 %     Weight --      Height --      Head Circumference --      Peak Flow --      Pain Score 06/24/19 2006 6     Pain Loc --      Pain Edu? --      Excl. in [place]? --    No data found.  Updated Vital Signs BP 115/77 (BP Location: Left Arm)    Pulse (!) 122    Temp 98.6 F (37 C) (Oral)    Resp 16    SpO2 97%   Visual Acuity Right Eye Distance:   Left Eye Distance:   Bilateral Distance:    Right Eye Near:   Left Eye Near:    Bilateral Near:     Physical Exam Constitutional:       General: She is not in acute distress.    Appearance: She is well-developed.  HENT:     Head: Normocephalic and atraumatic.  Eyes:     Conjunctiva/sclera: Conjunctivae normal.     Pupils: Pupils are equal, round, and reactive to light.  Neck:     Musculoskeletal: Normal range of motion.  Cardiovascular:     Rate and Rhythm: Normal rate.  Pulmonary:     Effort: Pulmonary effort is normal. No respiratory distress.  Chest:       Comments: No axillary adenopathy Abdominal:     General: There is no distension.     Palpations: Abdomen is soft.  Musculoskeletal: Normal range of motion.  Skin:    General: Skin is warm and dry.  Neurological:     Mental Status: She is alert.      UC Treatments / Results  Labs (all labs ordered are listed, but only abnormal results are displayed) Labs Reviewed - No data to display  EKG   Radiology No results found.  Procedures Procedures (including critical care time)  Medications Ordered in UC Medications - No data to display  Initial Impression / Assessment and Plan / UC Course  I have reviewed the triage vital signs and the nursing notes.  Pertinent labs & imaging results that were available during my care of the patient were reviewed by me and considered in my medical decision making (see chart for details).     Final Clinical Impressions(s) / UC Diagnoses   Final diagnoses:  Mastitis, right, acute     Discharge Instructions     Stop amoxicillin Start Septra twice a day Continue warm compresses and frequent nursing Call your doctor if not better in a couple days   ED Prescriptions    Medication Sig Dispense Auth. Provider   sulfamethoxazole-trimethoprim (BACTRIM DS) 800-160 MG tablet Take 1 tablet by mouth 2 (two) times daily for 7 days. 14 tablet Raylene Everts, MD     PDMP not reviewed this encounter.   Raylene Everts, MD 06/24/19 2026

## 2019-06-28 ENCOUNTER — Ambulatory Visit: Payer: Self-pay

## 2019-06-28 NOTE — Lactation Note (Signed)
This note was copied from a baby's chart. Lactation Consultation Note  Patient Name: Maria Mercado BJSEG'B Date: 06/28/2019     06/28/2019  Name: Maria Mercado MRN: [id number] Date of Birth: 04/01/2017 Gestational Age: Gestational Age: [redacted]w[redacted]d Birth Weight: 140 oz Weight today:  Weight: 11 lb 13.4 oz (5368 g) with clean size 18 diaper  58 month old infant presents today with mom for follow up feeding assessment.   Infant has gained 274 grams in the last 14 days with an average daily weight gain of 20 grams a day. Mom aware infant weight has decreased some.   Mom reports infant is feeding about every 1.5-2 hours during the day. He BF with each feeding. Infant is nursing more efficiently per mom. Infant will feed for about 20-25 minutes and taking both breasts with each feeding.   Mom has not been pumping recently. Mom reports pain is more with pumping on the breast that had Mastitis recently. Mom went back to Urgent Care of Friday and antibiotics have been changed. Mom has not noticed any blebs to the nipple. Mom reports the lump in her breast is gone. Enc mom to continue pumping 2-3 x a day to protect milk supply.   Mom feels like she is having a flare up of her Hidradenitis Suppurativa and may need to have surgery to remove. She has had it removed 5 times in the past.   Infant is pulling on and off the breast with feeding today. He is latching better per mom. Infant gets frustrated with feeding some today. Mom reports this is not his normal.   Infant with partially re-adhered labial frenulum. Upper lip stretches pretty well.  Infant with posterior lingual frenulum noted, infant with decreased mid tongue elevation noted. Infant with strong suckle on gloved finger. Infant with high palate. Infant pulls on and off the breast at times with feeding.   Infant with cheek dimpling with breast feeding. Infant spitting has improved per mom. Mom with pain with initial latch and  improves in about a minutes. Nipple is rounded post feeding.   Mom reports allergy testing for infant was negative.   Mom reports she spoke with Psychiatrist who recommended that she not take Sunflower Lecithin as she notes that some people have increase depression when taking it.   Infant to follow up with Dr. Cardell Peach on Friday 10/23 and then at 4 months. Infant to follow up with Lactation as needed. She will call as needed.     General Information: Mother's reason for visit: Follow up feeding assessment Consult: Follow-up Lactation consultant: Noralee Stain RN,IBCLC Breastfeeding experience: latching more and more efficient per mom Maternal medical conditions: Polycystic ovarian syndrome, Infertility, History post partum depression Maternal medications: Pre-natal vitamin, Other(Prozac, Wellbutrin)  Breastfeeding History: Frequency of breast feeding: every 1-2 hours during the day, sleeps 11pm-4 Duration of feeding: 20-25 minutes  Supplementation:                        Infant Output Assessment: Voids per 24 hours: 8+ Urine color: Clear yellow Stools per 24 hours: 2-4 Stool color: Yellow  Breast Assessment: Breast: Soft, Compressible Nipple: Erect Pain level: 0 Pain interventions: Bra  Feeding Assessment: Infant oral assessment: Variance Infant oral assessment comment: see note Positioning: Cradle(left breast, 15 minutes) Latch: 1 - Repeated attempts needed to sustain latch, nipple held in mouth throughout feeding, stimulation needed to elicit sucking reflex. Audible swallowing: 2 - Spontaneous and intermittent Type of  nipple: 2 - Everted at rest and after stimulation Comfort: 1 - Filling, red/small blisters or bruises, mild/mod discomfort Hold: 2 - No assistance needed to correctly position infant at breast LATCH score: 8 Latch assessment: Deep Lips flanged: Yes Suck assessment: Displays both   Pre-feed weight: 5368 grams Post feed weight: 5442 grams Amount  transferred: 74 ml Amount supplemented: 0  Additional Feeding Assessment:                                    Totals: Total amount transferred: 74 ml Total supplement given: 0 Total amount pumped post feed: did not pump  Plan: 1. Offer the breast with feeding cues as you have been 2. Keep infant awake at the breast with feeding 3. Massage/compress breast with feeding 4. Empty the first breast before offering the second breast 5. Offer infant a bottle of breast milk after the breast feeding if he is still cueing to feed 6. Infant needs about 99-133 (3.5-4.5 ounces) for 8 feedings a day or [phone] ml (27 ounces) in 24 hours. Infant may take more or less depending on how often he feeds. Feed infant until he is satisfied.  7. Continue to feed infant using the paced bottle feeding method 8. Would recommend that you keep pumping 2-3 times a day to protect milk supply until infant is feeding more consistently. Use your double electric when pumping. Using your hands free bra with pumping and massage the breast with pumping.  9. Keep up the good work 10. Thank you for allowing me to assist you today 11. Please call with any questions/concerns as needed [phone] 12. Follow up with Lactation as needed  Donn Pierini RN, IBCLC                                                          Debby Freiberg Dusan Lipford 06/28/2019, 9:55 AM

## 2019-07-12 ENCOUNTER — Ambulatory Visit: Payer: Self-pay

## 2019-07-12 NOTE — Lactation Note (Signed)
This note was copied from a baby's chart. Lactation Consultation Note  Patient Name: Maria Mercado OACZY'S Date: 07/12/2019     07/12/2019  Name: Maria Mercado MRN: [id number] Date of Birth: 04/01/2017 Gestational Age: Gestational Age: [redacted]w[redacted]d Birth Weight: 140 oz Weight today:  Weight: 12 lb 12.2 oz (5856 g)   70 month old term infant presents today with mom and older sister for feeding assessment. Infant fed an hour prior to coming for appt.   Infant has gained 422 grams in the last 14 days with an average daily weight gain of 30 grams a day.   Infant is only BF now and had one bottle in the last 2 weeks.   Mom reports she is still having pain with latch on the right breast. Infant is leaking milk on the right breast per mom. Mom has been restarted on ATB for mastitis on Saturday 10/31. Mom reports she had a rash to her breast and that is why ATB were started. Mom reports she only feed infant in the cradle position.   Mom reports her right nipple is sore with feeding unless she supports her breast with feeding.   Infant is eating every 1.75-2.25 hours during the day and feeds every 5 hours at night. He feeds about 7 times a day. Infant feeds for about 20 minutes and feeds on both breasts with each feeding.   Infant with thick labial frenulum noted. Upper lip flanges well. Infant with posterior lingual frenulum noted. Infant with decreased mid tongue elevation noted. Infant still using his cheek muscles when feeding at the breast with cheek dimpling noted. Infant with intermittent clicking to the breast. Infant choked some on the breast with feeding. Infant spits infrequently. Infant with high palate. Nipple rounded post feeding. Discussed with mom that Duncan feels like infant tongue is still restricted causing plugged ducts and cheek dimpling. Mom may call Dr. Delsa Sale to have infant reevaluated. Infant is being seen by a Chiropractor once a week.   Infant breast and fed for  about 15 minutes. Infant pulled on and off the breast towards the end of the feeding. Mom the changed sides. Infant with shallow latch and pulls in cheeks with feeding for most of the feeding.   Infant to follow up with Dr. Abner Greenspan at 43 months of age. Infant to follow up with Lactation as needed.   General Information: Mother's reason for visit: Follow up feeding assessment, weight check Consult: Follow-up Lactation consultant: Ivin Booty Raysean Graumann RN,IBCLC Breastfeeding experience: BF every 1.75-2.25 ounces during the day and every 5 hours at night Maternal medical conditions: Polycystic ovarian syndrome, History post partum depression, Infertility Maternal medications: Pre-natal vitamin, Other(Prozac, Wellbutrin)  Breastfeeding History: Frequency of breast feeding: every 1.75-2.25 hours during the day and every 5 hours at night Duration of feeding: 20-30 minutes  Supplementation: Supplement method: bottle         Breast milk volume: 3 ounces once in the last 2 weeks            Infant Output Assessment: Voids per 24 hours: 7+ Urine color: Clear yellow Stools per 24 hours: 1-4 Stool color: Yellow  Breast Assessment: Breast: Soft, Compressible Nipple: Erect Pain level: 5(better wtih support with feeding) Pain interventions: Bra  Feeding Assessment: Infant oral assessment: Variance Infant oral assessment comment: see note Positioning: Cradle(right and left breast) Latch: 1 - Repeated attempts needed to sustain latch, nipple held in mouth throughout feeding, stimulation needed to elicit sucking reflex. Audible swallowing: 2 -  Spontaneous and intermittent Type of nipple: 2 - Everted at rest and after stimulation Comfort: 1 - Filling, red/small blisters or bruises, mild/mod discomfort(pain to right breast, no pain to left breast) Hold: 2 - No assistance needed to correctly position infant at breast LATCH score: 8 Latch assessment: Shallow Lips flanged: Yes Suck assessment: Displays  both   Pre-feed weight: 5790 grams Post feed weight: 5848 grams Amount transferred: 58 ml Amount supplemented: 0  Additional Feeding Assessment:                                    Totals: Total amount transferred: 58 ml Total supplement given: 0 Total amount pumped post feed: did not pump   Plan:  1. Offer the breast with feeding cues as you have been 2. Keep infant awake at the breast with feeding 3. Massage/compress breast with feeding 4. Empty the first breast before offering the second breast 5. Offer infant a bottle of breast milk after the breast feeding if he is still cueing to feed 6. Infant needs about 107-143(3.5-4.5 ounces) for 8 feedings a day or 855+ ml (29+ ounces) in 24 hours. Infant may take more or less depending on how often he feeds. Feed infant until he is satisfied.  7. Continue to feed infant using the paced bottle feeding method 8. Would recommend that you keep pumping 2-3 times a day to protect milk supply until infant is feeding more consistently. Use your double electric when pumping. Using your hands free bra with pumping and massage the breast with pumping.  9. Keep up the good work 10. Thank you for allowing me to assist you today 11. Please call with any questions/concerns as needed [phone] 12. Follow up with Lactation as needed   Ed Blalock RN, IBCLC                                                     Silas Flood Reatha Sur 07/12/2019, 8:21 AM

## 2020-03-10 ENCOUNTER — Ambulatory Visit (HOSPITAL_COMMUNITY)
Admission: EM | Admit: 2020-03-10 | Discharge: 2020-03-10 | Disposition: A | Discharge status: Home / Self Care | Payer: No Typology Code available for payment source | Attending: Urgent Care | Admitting: Urgent Care

## 2020-03-10 ENCOUNTER — Other Ambulatory Visit: Payer: Self-pay

## 2020-03-10 ENCOUNTER — Encounter (HOSPITAL_COMMUNITY): Payer: Self-pay

## 2020-03-10 DIAGNOSIS — N61 Mastitis without abscess: Secondary | ICD-10-CM

## 2020-03-10 DIAGNOSIS — N644 Mastodynia: Secondary | ICD-10-CM

## 2020-03-10 MED ORDER — NAPROXEN 375 MG PO TABS: 375.0000 mg | ORAL_TABLET | Freq: Two times a day (BID) | ORAL | 0 refills | Status: DC

## 2020-03-10 MED ORDER — CLINDAMYCIN HCL 150 MG PO CAPS: 450.0000 mg | ORAL_CAPSULE | Freq: Three times a day (TID) | ORAL | 0 refills | Status: AC

## 2020-03-10 NOTE — ED Provider Notes (Signed)
[facility]   MRN: [id number] DOB: 1990/09/19  Subjective:   Maria Mercado is a 29 y.o. female presenting for 1 day history of recurrent left breast pain with swelling.  Patient is breast-feeding, switch to the right breast but her symptoms have progressed.  She has a history of mastitis.  Cannot recall the last antibiotic she took for this.  Denies skin changes, drainage from the nipple, nipple inversion.  Patient has not taken anything for pain.  No current facility-administered medications for this encounter.  Current Outpatient Medications:  .  acetaminophen (TYLENOL) 325 MG tablet, Take 2 tablets (650 mg total) by mouth every 4 (four) hours as needed (for pain scale < 4)., Disp: 30 tablet, Rfl: 1 .  buPROPion (WELLBUTRIN) 75 MG tablet, Take 75 mg by mouth daily. , Disp: , Rfl:  .  FLUoxetine (PROZAC) 20 MG tablet, Take 20 mg by mouth daily. , Disp: , Rfl:  .  ibuprofen (ADVIL) 600 MG tablet, Take 1 tablet (600 mg total) by mouth every 6 (six) hours., Disp: 30 tablet, Rfl: 0 .  Prenatal Vit-Fe Fumarate-FA (PRENATAL MULTIVITAMIN) TABS tablet, Take 2 tablets by mouth daily at 12 noon., Disp: , Rfl:    Allergies  Allergen Reactions  . Keflex [Cephalexin] Itching    Has taken Keflex but was okay, except for one generic brand that made her itch.     Past Medical History:  Diagnosis Date  . Anxiety   . Back pain   . Depression   . History of kidney stones   . HS (hereditary spherocytosis) (HCC)      Past Surgical History:  Procedure Laterality Date  . CYST EXCISION     breast and axial   . WISDOM TOOTH EXTRACTION      Family History  Problem Relation Age of Onset  . Hypertension Mother   . Hypertension Father   . Stroke Maternal Grandfather   . Heart attack Maternal Grandfather     Social History   Tobacco Use  . Smoking status: Never Smoker  . Smokeless tobacco: Never Used  Vaping Use  . Vaping Use: Never used  Substance Use Topics  . Alcohol use:  Not Currently    Comment: occ  . Drug use: Never    ROS   Objective:   Vitals: BP 134/80   Pulse 85   Temp 98 F (36.7 C) (Oral)   Resp 16   Ht 5\' 5"  (1.651 m)   Wt 185 lb (83.9 kg)   SpO2 100%   BMI 30.79 kg/m   Physical Exam Exam conducted with a chaperone present (CMA Mariela).  Constitutional:      General: She is not in acute distress.    Appearance: Normal appearance. She is well-developed. She is not ill-appearing.  HENT:     Head: Normocephalic and atraumatic.     Nose: Nose normal.     Mouth/Throat:     Mouth: Mucous membranes are moist.     Pharynx: Oropharynx is clear.  Eyes:     General: No scleral icterus.    Extraocular Movements: Extraocular movements intact.     Pupils: Pupils are equal, round, and reactive to light.  Cardiovascular:     Rate and Rhythm: Normal rate.  Pulmonary:     Effort: Pulmonary effort is normal.  Chest:     Chest wall: Swelling (Over area outlined with slight associated warmth but no erythema) and tenderness (Over area outlined) present.     Breasts:  Left: Swelling and tenderness present. No bleeding, inverted nipple, mass, nipple discharge or skin change.    Skin:    General: Skin is warm and dry.  Neurological:     General: No focal deficit present.     Mental Status: She is alert and oriented to person, place, and time.  Psychiatric:        Mood and Affect: Mood normal.        Behavior: Behavior normal.       Assessment and Plan :   PDMP not reviewed this encounter.  1. Mastitis in female   2. Breast pain, left     Given allergy to Keflex will hold off on using medications in this class.  Patient is to start clindamycin as per up-to-date at a dose of 450 mg 3 times daily.  Recommended use of Naprosyn at 375 mg twice daily.  Both medications acceptable for RID in breastfeeding. Counseled patient on potential for adverse effects with medications prescribed/recommended today, ER and return-to-clinic  precautions discussed, patient verbalized understanding.    Wallis Bamberg, PA-C 03/11/20 1012

## 2020-03-10 NOTE — ED Triage Notes (Signed)
Pt c/o mastitis on left breast started yesterday. Pt breast feeds.

## 2020-03-19 ENCOUNTER — Encounter: Payer: Self-pay | Admitting: Physician Assistant

## 2020-04-18 ENCOUNTER — Other Ambulatory Visit (INDEPENDENT_AMBULATORY_CARE_PROVIDER_SITE_OTHER): Payer: No Typology Code available for payment source

## 2020-04-18 ENCOUNTER — Encounter: Payer: Self-pay | Admitting: Physician Assistant

## 2020-04-18 ENCOUNTER — Ambulatory Visit (INDEPENDENT_AMBULATORY_CARE_PROVIDER_SITE_OTHER): Payer: No Typology Code available for payment source | Admitting: Physician Assistant

## 2020-04-18 VITALS — BP 92/60 | HR 100 | Ht 65.0 in | Wt 186.0 lb

## 2020-04-18 DIAGNOSIS — R7989 Other specified abnormal findings of blood chemistry: Secondary | ICD-10-CM

## 2020-04-18 LAB — COMPREHENSIVE METABOLIC PANEL
ALT: 13 U/L (ref 0–35)
AST: 13 U/L (ref 0–37)
Albumin: 4.5 g/dL (ref 3.5–5.2)
Alkaline Phosphatase: 137 U/L — ABNORMAL HIGH (ref 39–117)
BUN: 14 mg/dL (ref 6–23)
CO2: 19 mEq/L (ref 19–32)
Calcium: 9.3 mg/dL (ref 8.4–10.5)
Chloride: 105 mEq/L (ref 96–112)
Creatinine, Ser: 0.65 mg/dL (ref 0.40–1.20)
GFR: 109.22 mL/min (ref >60.00)
Glucose, Bld: 93 mg/dL (ref 70–99)
Potassium: 4.1 mEq/L (ref 3.5–5.1)
Sodium: 139 mEq/L (ref 135–145)
Total Bilirubin: 0.5 mg/dL (ref 0.2–1.2)
Total Protein: 8.1 g/dL (ref 6.0–8.3)

## 2020-04-18 LAB — CBC WITH DIFFERENTIAL/PLATELET
Basophils Absolute: 0 10*3/uL (ref 0.0–0.1)
Basophils Relative: 0.1 % (ref 0.0–3.0)
Eosinophils Absolute: 0.1 10*3/uL (ref 0.0–0.7)
Eosinophils Relative: 1 % (ref 0.0–5.0)
HCT: 39.7 % (ref 36.0–46.0)
Hemoglobin: 13 g/dL (ref 12.0–15.0)
Lymphocytes Relative: 8.9 % — ABNORMAL LOW (ref 12.0–46.0)
Lymphs Abs: 1.1 10*3/uL (ref 0.7–4.0)
MCHC: 32.6 g/dL (ref 30.0–36.0)
MCV: 79.9 fl (ref 78.0–100.0)
Monocytes Absolute: 0.7 10*3/uL (ref 0.1–1.0)
Monocytes Relative: 5.8 % (ref 3.0–12.0)
Neutro Abs: 9.9 10*3/uL — ABNORMAL HIGH (ref 1.4–7.7)
Neutrophils Relative %: 84.2 % — ABNORMAL HIGH (ref 43.0–77.0)
Platelets: 279 10*3/uL (ref 150.0–400.0)
RBC: 4.97 Mil/uL (ref 3.87–5.11)
RDW: 15 % (ref 11.5–15.5)
WBC: 11.8 10*3/uL — ABNORMAL HIGH (ref 4.0–10.5)

## 2020-04-18 LAB — FERRITIN: Ferritin: 9 ng/mL — ABNORMAL LOW (ref 10.0–291.0)

## 2020-04-18 LAB — C-REACTIVE PROTEIN: CRP: <1 mg/dL (ref 0.5–20.0)

## 2020-04-18 LAB — SEDIMENTATION RATE: Sed Rate: 52 mm/hr — ABNORMAL HIGH (ref 0–20)

## 2020-04-18 NOTE — Patient Instructions (Signed)
If you are age 27 or older, your body mass index should be between 23-30. Your Body mass index is 30.95 kg/m. If this is out of the aforementioned range listed, please consider follow up with your Primary Care Provider.  If you are age 71 or younger, your body mass index should be between 19-25. Your Body mass index is 30.95 kg/m. If this is out of the aformentioned range listed, please consider follow up with your Primary Care Provider.   Your provider has requested that you go to the basement level for lab work before leaving today. Press "B" on the elevator. The lab is located at the first door on the left as you exit the elevator.  You have been scheduled for an abdominal ultrasound at [facility] (1st floor of hospital) on 04/25/2020 at 8:00 am. Please arrive 15 minutes prior to your appointment for registration. Make certain not to have anything to eat or drink 6 hours prior to your appointment. Should you need to reschedule your appointment, please contact radiology at [phone]. This test typically takes about 30 minutes to perform.  Due to recent changes in healthcare laws, you may see the results of your imaging and laboratory studies on MyChart before your provider has had a chance to review them.  We understand that in some cases there may be results that are confusing or concerning to you. Not all laboratory results come back in the same time frame and the provider may be waiting for multiple results in order to interpret others.  Please give [place] 48 hours in order for your provider to thoroughly review all the results before contacting the office for clarification of your results.   You have been scheduled to follow up with Mike Gip, PA-C on May 24, 2020 at 9:00 am

## 2020-04-18 NOTE — Progress Notes (Signed)
Subjective:    Patient ID: Maria Mercado, female    DOB: 1991-09-02, 29 y.o.   MRN: [id number]  HPI Maria Mercado is a pleasant 29 year old white female, new to GI today and referred by the [facility] for evaluation of elevated LFTs.  She relates that she had never been told previously that she had elevated LFTs but when she had labs done in June, she was told that she had had a rise in her liver tests.  She has no known history of any liver issues. Family history is negative for liver disease as far she is aware.  Patient is in generally good health, does have history of hereditary spherocytosis, and has not had any issues as a result. She has been on Prozac over the past couple of years, no other regular medicines.  No regular EtOH.  She has been vaccinated for hepatitis B. She is currently asymptomatic and says she feels fine.  She has no complaints of abdominal discomfort, no nausea or vomiting or indigestion, and energy level is good for her.  She does have a 57-year-old. No copies of labs were sent with the information from the [place], and we have requested labs today.  Review of Systems Pertinent positive and negative review of systems were noted in the above HPI section.  All other review of systems was otherwise negative.  Outpatient Encounter Medications as of 04/18/2020  Medication Sig  . acetaminophen (TYLENOL) 325 MG tablet Take 2 tablets (650 mg total) by mouth every 4 (four) hours as needed (for pain scale < 4).  Marland Kitchen FLUoxetine (PROZAC) 20 MG tablet Take 20 mg by mouth daily.   Marland Kitchen ibuprofen (ADVIL) 600 MG tablet Take 1 tablet (600 mg total) by mouth every 6 (six) hours.  . [DISCONTINUED] buPROPion (WELLBUTRIN) 75 MG tablet Take 75 mg by mouth daily.   . [DISCONTINUED] naproxen (NAPROSYN) 375 MG tablet Take 1 tablet (375 mg total) by mouth 2 (two) times daily with a meal.  . [DISCONTINUED] Prenatal Vit-Fe Fumarate-FA (PRENATAL MULTIVITAMIN) TABS tablet Take 2 tablets by mouth daily at 12  noon.   No facility-administered encounter medications on file as of 04/18/2020.   Allergies  Allergen Reactions  . Keflex [Cephalexin] Itching    Has taken Keflex but was okay, except for one generic brand that made her itch.    Patient Active Problem List   Diagnosis Date Noted  . Post-dates pregnancy 04/01/2019  . NSVD (normal spontaneous vaginal delivery) 04/01/2019   Social History   Socioeconomic History  . Marital status: Married    Spouse name: Not on file  . Number of children: Not on file  . Years of education: Not on file  . Highest education level: Not on file  Occupational History  . Not on file  Tobacco Use  . Smoking status: Never Smoker  . Smokeless tobacco: Never Used  Vaping Use  . Vaping Use: Never used  Substance and Sexual Activity  . Alcohol use: Not Currently    Comment: occ  . Drug use: Never  . Sexual activity: Yes    Birth control/protection: None  Other Topics Concern  . Not on file  Social History Narrative  . Not on file   Social Determinants of Health   Financial Resource Strain:   . Difficulty of Paying Living Expenses:   Food Insecurity:   . Worried About Charity fundraiser in the Last Year:   . Worth in the Last Year:  Transportation Needs:   . Film/video editor (Medical):   Marland Kitchen Lack of Transportation (Non-Medical):   Physical Activity:   . Days of Exercise per Week:   . Minutes of Exercise per Session:   Stress:   . Feeling of Stress :   Social Connections:   . Frequency of Communication with Friends and Family:   . Frequency of Social Gatherings with Friends and Family:   . Attends Religious Services:   . Active Member of Clubs or Organizations:   . Attends Archivist Meetings:   Marland Kitchen Marital Status:   Intimate Partner Violence:   . Fear of Current or Ex-Partner:   . Emotionally Abused:   Marland Kitchen Physically Abused:   . Sexually Abused:     Maria Mercado family history includes Heart attack in her  maternal grandfather; Hypertension in her father and mother; Stroke in her maternal grandfather.      Objective:    Vitals:   04/18/20 0844  BP: 92/60  Pulse: 100    Physical Exam Well-developed well-nourished WF  in no acute distress.  Height, Weight, 186 BMI 30.9 accompanied by her 2 children  HEENT; nontraumatic normocephalic, EOMI, PE RR LA, sclera anicteric. Oropharynx; not done Neck; supple, no JVD Cardiovascular; regular rate and rhythm with S1-S2, no murmur rub or gallop Pulmonary; Clear bilaterally Abdomen; soft, nontender, nondistended, no palpable mass or hepatosplenomegaly, bowel sounds are active Rectal; not done Skin; benign exam, no jaundice rash or appreciable lesions Extremities; no clubbing cyanosis or edema skin warm and dry Neuro/Psych; alert and oriented x4, grossly nonfocal mood and affect appropriate       Assessment & Plan:   #38 29 year old white female veteran, referred for persistently elevated LFTs.  Patient is asymptomatic and has no prior history of liver disease or liver issues that she is aware of. I do not have any labs for review today, we have requested from the [facility].  Etiology of elevated LFTs unclear, she does have history of hereditary spherocytosis, and will need to consider secondary to some degree of hemolysis.  Also rule out chronic viral hepatitis, underlying autoimmune liver disease or other inheritable form of liver disease.  #2 history of anxiety/depression #3.  History of ureterolithiasis  Plan; Have requested recent labs from the [place]. Check CBC with differential, c-Met, sed rate, hepatitis B and C serologies autoimmune markers and inheritable markers. Patient will be scheduled for upper abdominal ultrasound. Patient will be established with Dr. Loletha Carrow, I will plan to follow-up in the office in about 1 month.  Daking Westervelt S Thai Burgueno PA-C 04/18/2020   Cc: No ref. provider found

## 2020-04-19 NOTE — Progress Notes (Signed)
____________________________________________________________  Attending physician addendum:  Thank you for sending this case to me. I have reviewed the entire note, and the outlined plan seems appropriate.  I reviewed the lab results so far.  LFTS yesterday normal.  Immune to Hep A, no chronic Heb B or C.  Ferritin low at 9 with normal Hgb - most likely from menstrual blood loss.    Should be advised to take an OTC iron tablet daily for a month and then have iron levels checked periodically with primary care.  Also, the ultrasound and office follow up can be canceled.  This patient does not have elevated LFTs.  Amada Jupiter, MD  ____________________________________________________________

## 2020-04-20 LAB — HEPATITIS B SURFACE ANTIGEN: Hepatitis B Surface Ag: NONREACTIVE

## 2020-04-20 LAB — CERULOPLASMIN: Ceruloplasmin: 31 mg/dL (ref 18–53)

## 2020-04-20 LAB — HEPATITIS B SURFACE ANTIBODY,QUALITATIVE: Hep B S Ab: NONREACTIVE

## 2020-04-20 LAB — ALPHA-1-ANTITRYPSIN: A-1 Antitrypsin, Ser: 154 mg/dL (ref 83–199)

## 2020-04-20 LAB — MITOCHONDRIAL ANTIBODIES: Mitochondrial M2 Ab, IgG: <=20 U

## 2020-04-20 LAB — HEPATITIS A ANTIBODY, TOTAL: Hepatitis A AB,Total: REACTIVE — AB

## 2020-04-20 LAB — ANA: Anti Nuclear Antibody (ANA): NEGATIVE

## 2020-04-20 LAB — HEPATITIS C ANTIBODY
Hepatitis C Ab: NONREACTIVE
SIGNAL TO CUT-OFF: 0.01 (ref <1.00)

## 2020-04-25 ENCOUNTER — Ambulatory Visit (HOSPITAL_COMMUNITY): Payer: No Typology Code available for payment source

## 2020-04-25 ENCOUNTER — Other Ambulatory Visit: Payer: Self-pay

## 2020-04-25 DIAGNOSIS — R7989 Other specified abnormal findings of blood chemistry: Secondary | ICD-10-CM

## 2020-05-11 ENCOUNTER — Other Ambulatory Visit (HOSPITAL_COMMUNITY): Payer: No Typology Code available for payment source

## 2020-05-24 ENCOUNTER — Ambulatory Visit: Payer: No Typology Code available for payment source | Admitting: Physician Assistant

## 2020-05-27 ENCOUNTER — Inpatient Hospital Stay (HOSPITAL_COMMUNITY)
Admission: AD | Admit: 2020-05-27 | Discharge: 2020-05-27 | Disposition: A | Discharge status: Home / Self Care | Attending: Obstetrics and Gynecology | Admitting: Obstetrics and Gynecology

## 2020-05-27 ENCOUNTER — Other Ambulatory Visit: Payer: Self-pay

## 2020-05-27 DIAGNOSIS — N61 Mastitis without abscess: Secondary | ICD-10-CM | POA: Insufficient documentation

## 2020-05-27 DIAGNOSIS — Z881 Allergy status to other antibiotic agents status: Secondary | ICD-10-CM | POA: Insufficient documentation

## 2020-05-27 MED ORDER — CLINDAMYCIN HCL 150 MG PO CAPS: 450.0000 mg | ORAL_CAPSULE | Freq: Three times a day (TID) | ORAL | 0 refills | Status: AC

## 2020-05-27 MED ORDER — IBUPROFEN 800 MG PO TABS: 800.0000 mg | ORAL_TABLET | Freq: Three times a day (TID) | ORAL | 0 refills | Status: AC

## 2020-05-27 NOTE — MAU Note (Signed)
Maria Mercado is a 29 y.o. at Unknown here in MAU reporting: states she has mastitis. Noticed some symptoms starting late Thursday into Friday. States she is having pain with nursing, a burning sensation, the area is warm to touch and red.   Onset of complaint: ongoing  Pain score: 6/10  Vitals:   05/27/20 0944  BP: 117/82  Pulse: 92  Resp: 16  Temp: 98.5 F (36.9 C)  SpO2: 100%     Lab orders placed from triage: none

## 2020-05-27 NOTE — MAU Provider Note (Signed)
History     CSN: [id number]  Arrival date and time: 05/27/20 3299   First Provider Initiated Contact with Patient 05/27/20 1002      Chief Complaint  Patient presents with  . Mastitis   HPI HPI: Wallis Spizzirri is a 29 y.o. [id number] non pregnant female who presents to Maternity Admissions reporting left breast pain. Patient is breastfeeding her 44 month old. Symptoms started on Friday with pressure in her left breast. She has had mastitis 3+ times during her postpartum course, last time was in July. Has been breastfeeding her child 2-3 times per day. Reports significant pain in her left breast that is worse with touch & feeding. No masses or abnormal nipple discharge. Has had chills at home but no fever. Has been taking tylenol for symptoms without relief.  Location: left breast Quality: burning, throbbing Severity: 6/10 on pain scale Duration: 2 days Timing: constant Modifying factors: worse with touch & breast feeding Associated signs and symptoms: chills   Past Medical History:  Diagnosis Date  . Anxiety   . Back pain   . Depression   . History of kidney stones   . HS (hereditary spherocytosis) (HCC)     Past Surgical History:  Procedure Laterality Date  . CYST EXCISION     breast and axial   . WISDOM TOOTH EXTRACTION      Family History  Problem Relation Age of Onset  . Hypertension Mother   . Hypertension Father   . Stroke Maternal Grandfather   . Heart attack Maternal Grandfather     Social History   Tobacco Use  . Smoking status: Never Smoker  . Smokeless tobacco: Never Used  Vaping Use  . Vaping Use: Never used  Substance Use Topics  . Alcohol use: Not Currently    Comment: occ  . Drug use: Never    Allergies:  Allergies  Allergen Reactions  . Keflex [Cephalexin] Itching    Has taken Keflex but was okay, except for one generic brand that made her itch.     No medications prior to admission.    Review of Systems  Constitutional: Positive  for chills. Negative for fever.  Gastrointestinal: Negative.   Musculoskeletal:       + left breast pain   Physical Exam   Blood pressure 117/82, pulse 92, temperature 98.5 F (36.9 C), temperature source Oral, resp. rate 16, SpO2 100 %, unknown if currently breastfeeding.  Physical Exam Vitals and nursing note reviewed. Exam conducted with a chaperone present.  Constitutional:      General: She is not in acute distress.    Appearance: Normal appearance.  HENT:     Head: Normocephalic and atraumatic.  Pulmonary:     Effort: Pulmonary effort is normal. No respiratory distress.  Chest:     Breasts: Breasts are asymmetrical (right breast larger than left; per patient this is normal for her).        Right: Normal.        Left: Tenderness (tenderness throughout left breast & tail of spence) present. No swelling, inverted nipple, mass, nipple discharge or skin change.  Lymphadenopathy:     Upper Body:     Right upper body: No axillary adenopathy.     Left upper body: No axillary adenopathy.  Neurological:     Mental Status: She is alert.     MAU Course  Procedures  MDM  Pt afebrile. Breast exam performed with chaperone present. Significant tenderness throughout left breast. No abnormal nipple  discharge. No palpable masses & no axillary lymphadenopathy. Will tx with clindamycin d/t keflex allergy. Will also send in rx ibuprofen to take for the next 3 days. Will have lactation f/u with patient this week.   Assessment and Plan   1. Mastitis, left, acute   -Rx clindamycin & ibuprofen -Msg to Noralee Stain for f/u phone call  Judeth Horn 05/27/2020, 10:45 AM

## 2020-05-27 NOTE — Discharge Instructions (Signed)
Mastitis  Mastitis is inflammation of the breast tissue. It occurs most often in women who are breastfeeding, but it can also affect other women, and sometimes even men. What are the causes? This condition is usually caused by a bacterial infection. Bacteria enter the breast tissue through cuts or openings in the skin. Typically, this occurs with breastfeeding because of cracked or irritated nipples. Sometimes, it can occur when there is no opening in the skin. This is usually caused by plugged milk ducts. Other causes include:  Nipple piercing.  Some forms of breast cancer. What are the signs or symptoms? Symptoms of this condition include:  Swelling, redness, tenderness, and pain in an area of the breast. The area may also feel warm to the touch. These symptoms usually affect the upper part of the breast, toward the armpit region.  Swelling of the glands under the arm on the same side.  Fever.  Rapid pulse.  Fatigue, headache, and flu-like muscle aches. If an infection is allowed to progress, a collection of pus (abscess) may develop. How is this diagnosed? This condition can usually be diagnosed based on a physical exam and your symptoms. You may also have other tests, such as:  Blood tests to determine if your body is fighting a bacterial infection.  Mammogram or ultrasound tests to rule out other problems or diseases.  Testing of pus and other fluids. Pus from the breast may be collected and examined in the lab. If an abscess has developed, the fluid in the abscess can be removed with a needle. This test can be used to confirm the diagnosis and identify the bacteria present.  If you are breastfeeding, breast milk may be cultured and tested for bacteria. How is this treated? Treatment for this condition may include:  Applying heat or cold compresses to the affected area.  Medicine for pain.  Antibiotic medicine to treat a bacterial infection. This is usually taken by  mouth.  Self-care such as rest and increased fluid intake.  If an abscess has developed, it may be treated by removing fluid with a needle. Mastitis that occurs with breastfeeding will sometimes go away on its own, so your health care provider may choose to wait 24 hours after first seeing you to decide whether a prescription medicine is needed. You may be told of different ways to help manage breastfeeding, such as continuing to breastfeed or pump in order to ensure adequate milk flow. Follow these instructions at home: Medicines  Take over-the-counter and prescription medicines only as told by your health care provider.  If you were prescribed an antibiotic medicine, take it as told by your health care provider. Do not stop taking the antibiotic even if you start to feel better. General instructions  Do not wear a tight or underwire bra. Wear a soft, supportive bra.  Increase your fluid intake, especially if you have a fever.  Get plenty of rest. If you are breastfeeding:  Continue to empty your breasts as often as possible either by breastfeeding or by using a breast pump. This will decrease the pressure and the pain that comes with it. Ask your health care provider if changes need to be made to your breastfeeding or pumping routine.  Keep your nipples clean and dry.  During breastfeeding, empty the first breast completely before going to the other breast. If your baby is not emptying your breasts completely, use a breast pump to empty your breasts.  Use breast massage during feeding or pumping sessions.    If directed, apply moist heat to the affected area of your breast right before breastfeeding or pumping. Use the heat source that your health care provider recommends.  If directed, put ice on the affected area of your breast right after breastfeeding or pumping: ? Put ice in a plastic bag. ? Place a towel between your skin and the bag. ? Leave the ice on for 20 minutes.  If  you go back to work, pump your breasts while at work to stay within your nursing schedule.  Avoid allowing your breasts to become overly filled with milk (engorged). Contact a health care provider if:  You have pus-like discharge from the breast.  You have a fever.  Your symptoms do not improve within 2 days of starting treatment. Get help right away if:  Your pain and swelling are getting worse.  You have pain that is not controlled with medicine.  You have a red line extending from the breast toward your armpit. Summary  Mastitis is inflammation of the breast tissue. It occurs most often in women who are breastfeeding, but it can also affect non-breastfeeding women and some men.  This condition is usually caused by a bacterial infection.  This condition may be treated with hot and cold compresses, medicines, self-care, and certain breastfeeding strategies.  If you were prescribed an antibiotic medicine, take it as told by your health care provider. Do not stop taking the antibiotic even if you start to feel better. This information is not intended to replace advice given to you by your health care provider. Make sure you discuss any questions you have with your health care provider. Document Revised: 08/07/2017 Document Reviewed: 09/16/2016 Elsevier Patient Education  2020 [employer] Inc[employer]  

## 2020-06-11 ENCOUNTER — Telehealth (INDEPENDENT_AMBULATORY_CARE_PROVIDER_SITE_OTHER): Admitting: Lactation Services

## 2020-06-11 NOTE — Telephone Encounter (Signed)
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   She reports she has Mastitis about 5 times. She reports infant was self weaning recently and dropped his feedings during the day to every 2 hours to 3 times a day and then at night.   She did have a plugged duct area prior to this last bout of mastitis. She tried warm compresses and pumping with Haakaa in the shower. By morning she had Mastitis.   Patient does not have a current OB. She is planning to follow up with the [place].   Baby is 15 months and is weaning more per mom. He is nursing at bedtime, nap time and through night.   Reviewed when plugs occur to use warm moist compresses every 2 hours and follow with infant nursing, pumping, or hand expressing until plug is resolved.   Reviewed Sunflower Lecithin 1200 mg 4 times a day when plugs is present or 2 times a day as maintenance.   Patient voiced understanding and plans to try the Sunflower Lecithin.  She has no further questions or concerns at this time.

## 2020-06-11 NOTE — Telephone Encounter (Signed)
-----   Message from Maria Horn, NP sent at 05/27/2020  9:49 AM EDT ----- Can you reach out to this patient please? It looks like you've talked to her in the past. Breastfeeding her 40 month old & has mastitis again - I think this is her 3rd case in the last year. I'm starting her on abx but thought you could check up on her. Also, I think she's interested in weaning.

## 2020-06-12 IMAGING — US US RENAL
1 series · 15 of 25 positions shown · non-contrast
Comparison: CT 05/21/2018

CLINICAL DATA: Right flank pain.  History of kidney stones

EXAM:
RENAL / URINARY TRACT ULTRASOUND COMPLETE

[Series 1: us renal · 15 of 26 slices shown]
[im 1/26]
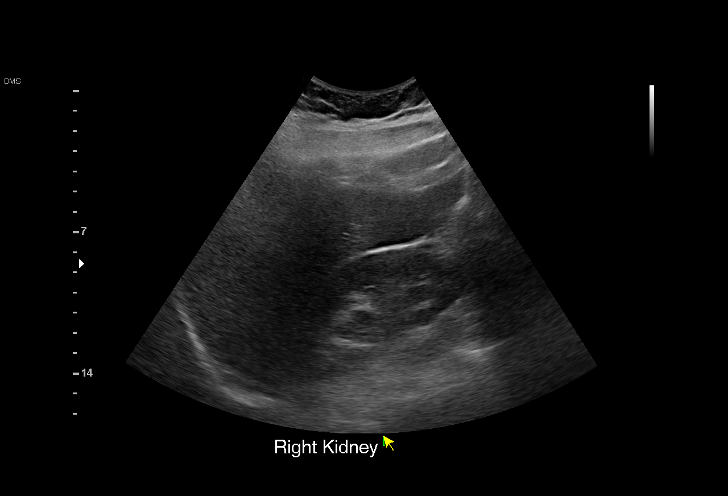
[im 3/26]
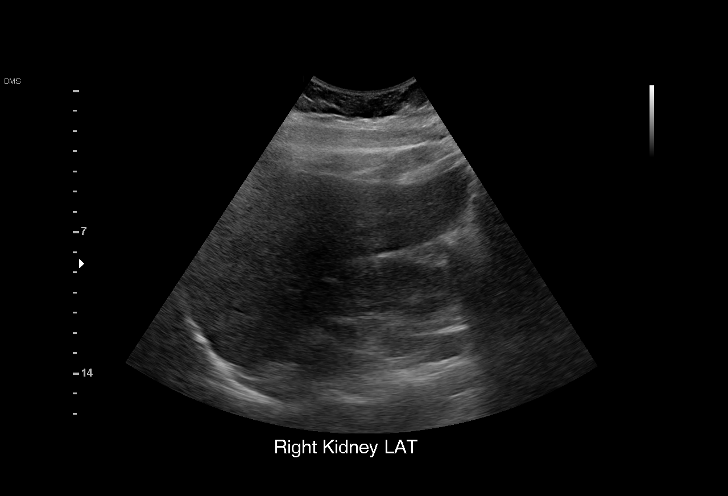
[im 5/26]
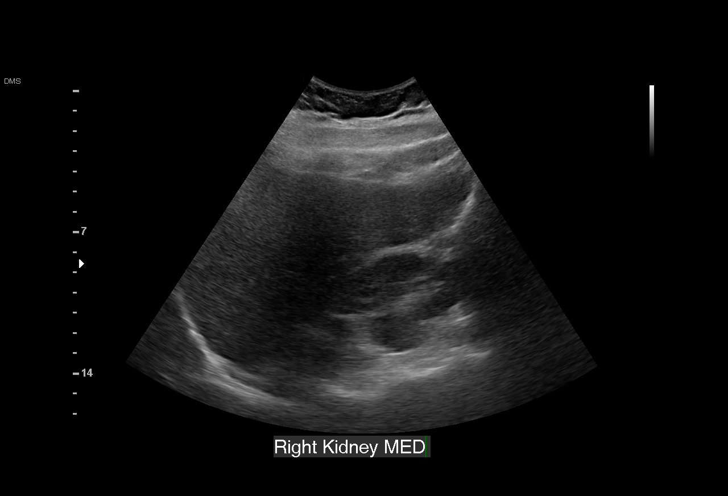
[im 6/26]
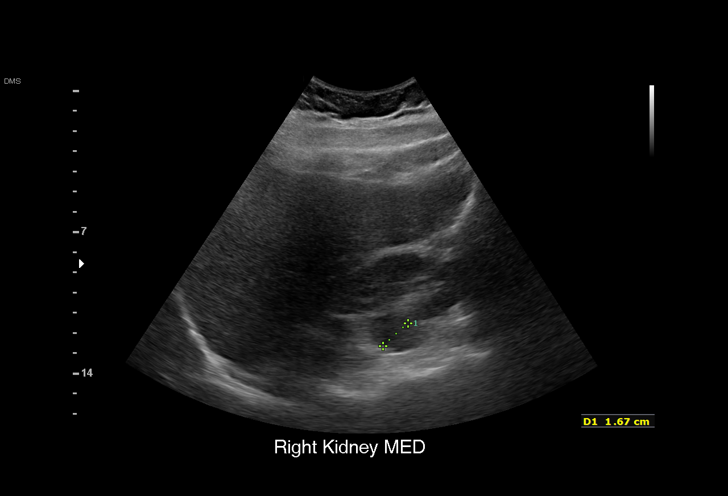
[im 8/26]
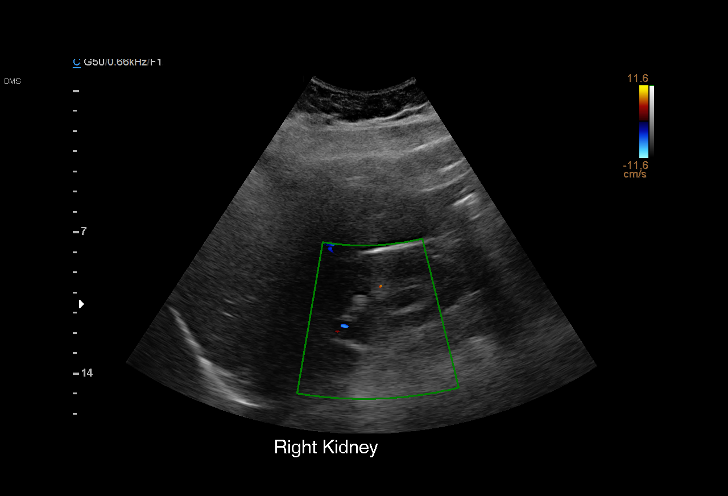
[im 10/26]
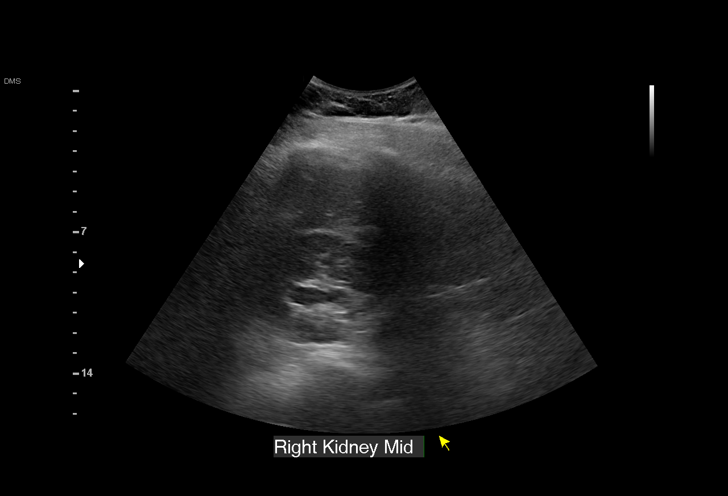
[im 11/26]
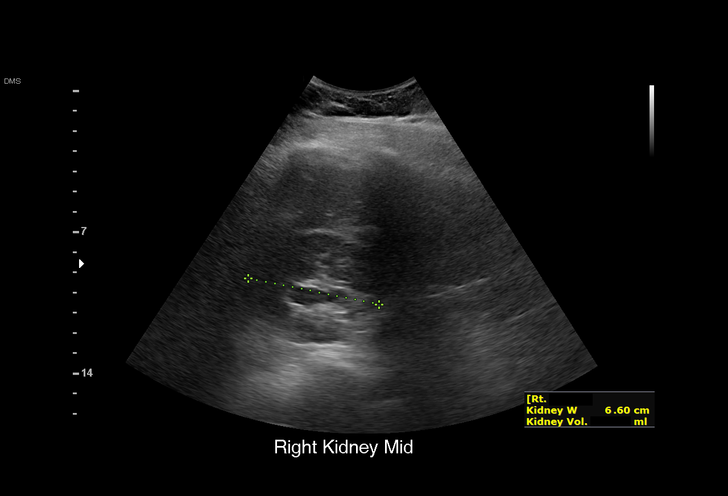
[im 13/26]
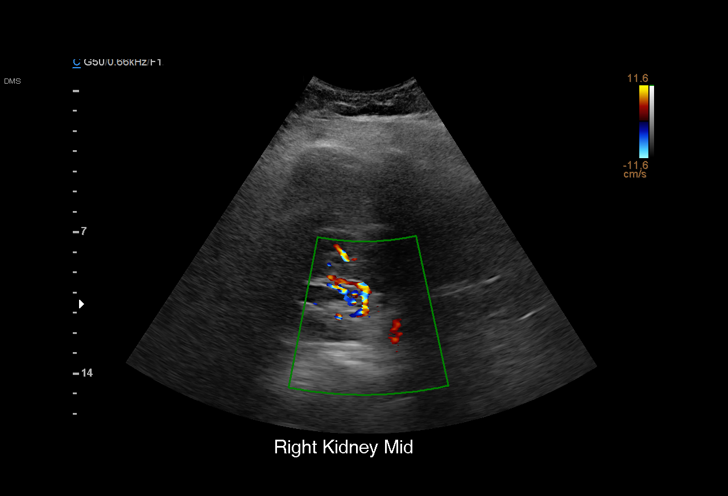
[im 15/26]
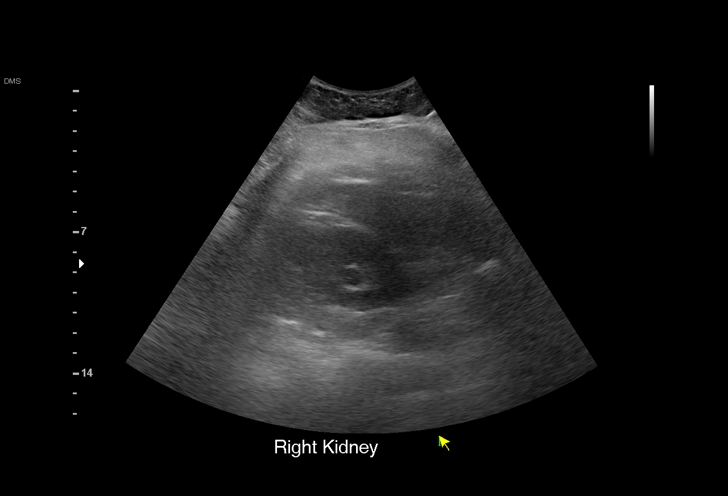
[im 16/26]
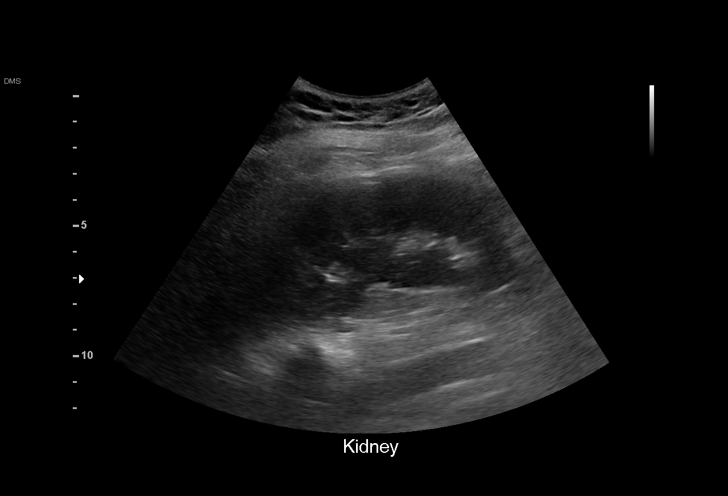
[im 18/26]
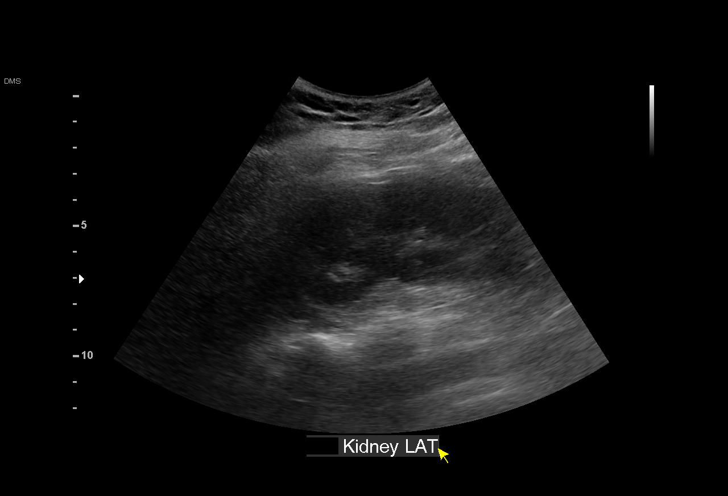
[im 20/26]
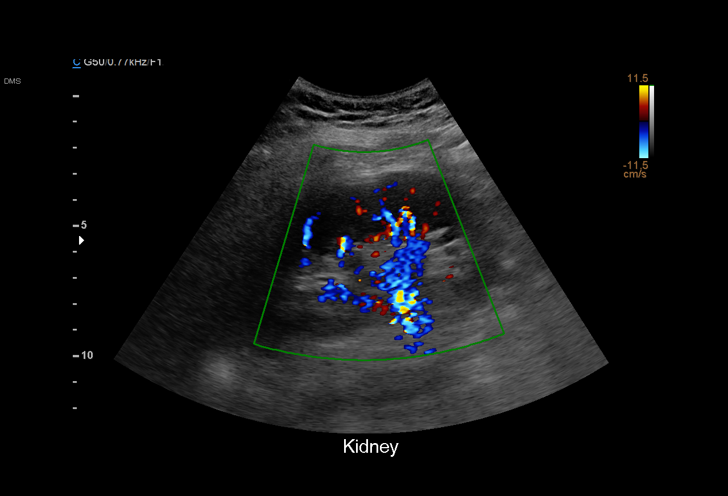
[im 21/26]
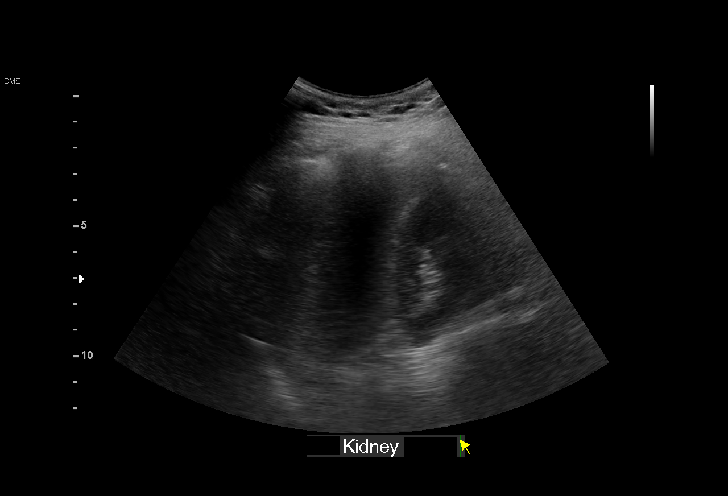
[im 23/26]
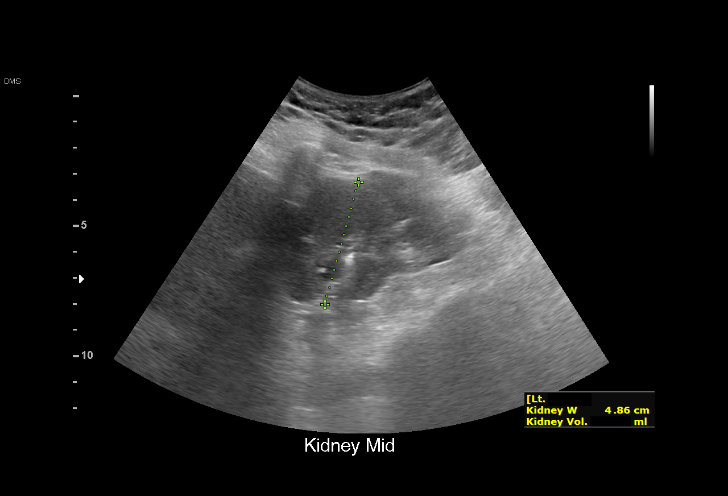
[im 26/26]
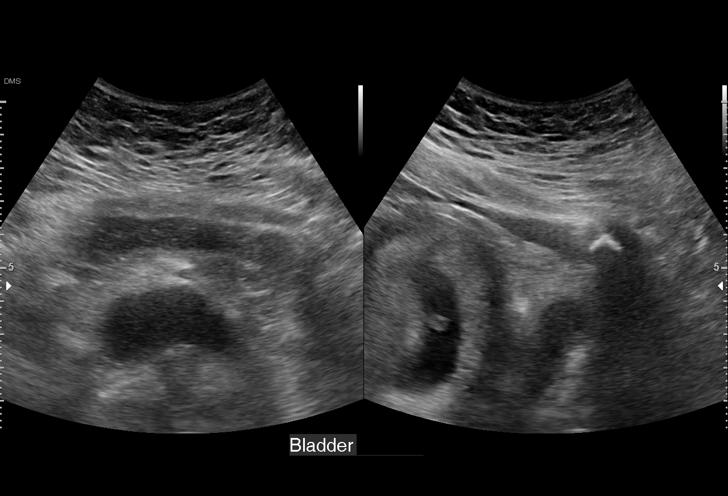

[15 of 25 positions shown; findings below may reference images not displayed]

FINDINGS: Right Kidney:

Renal measurements: 11.2 x 4.9 x 6.0 cm = volume: 190 mL. Mild right
hydronephrosis. Visualized proximal right ureter mildly dilated. No
mass. Normal echotexture.

Left Kidney:

Renal measurements: 10.2 x 5.0 x 4.9 cm = volume: 129 mL.
Echogenicity within normal limits. No mass or hydronephrosis
visualized.

Bladder:

Decompressed, not well visualized.
IMPRESSION: Mild right hydronephrosis.

## 2020-07-26 ENCOUNTER — Telehealth: Payer: Self-pay

## 2020-07-26 NOTE — Telephone Encounter (Signed)
-----   Message from Missy Sabins, RN sent at 04/25/2020 11:07 AM EDT ----- Regarding: Labs Repeat LFTs, order in epic

## 2020-07-26 NOTE — Telephone Encounter (Signed)
Left detailed message on patient's voicemail reminding her that she is due for repeat lab work. Advised that no appointment is necessary and that she can go by the lab in the basement of our office building at her convenience between 7:30 AM - 5 PM, Monday through Friday. Advised that if she had any questions to give me a call back.

## 2021-04-22 ENCOUNTER — Inpatient Hospital Stay (HOSPITAL_COMMUNITY)
Admission: AD | Admit: 2021-04-22 | Discharge: 2021-04-23 | Disposition: A | Discharge status: Home / Self Care | Attending: Obstetrics & Gynecology | Admitting: Obstetrics & Gynecology

## 2021-04-22 ENCOUNTER — Encounter (HOSPITAL_COMMUNITY): Payer: Self-pay | Admitting: *Deleted

## 2021-04-22 DIAGNOSIS — O26891 Other specified pregnancy related conditions, first trimester: Secondary | ICD-10-CM | POA: Insufficient documentation

## 2021-04-22 DIAGNOSIS — Z3A01 Less than 8 weeks gestation of pregnancy: Secondary | ICD-10-CM | POA: Insufficient documentation

## 2021-04-22 DIAGNOSIS — R42 Dizziness and giddiness: Secondary | ICD-10-CM | POA: Insufficient documentation

## 2021-04-22 DIAGNOSIS — Z881 Allergy status to other antibiotic agents status: Secondary | ICD-10-CM | POA: Diagnosis not present

## 2021-04-22 DIAGNOSIS — O210 Mild hyperemesis gravidarum: Secondary | ICD-10-CM

## 2021-04-22 LAB — URINALYSIS, ROUTINE W REFLEX MICROSCOPIC
Bilirubin Urine: NEGATIVE
Glucose, UA: NEGATIVE mg/dL
Hgb urine dipstick: NEGATIVE
Ketones, ur: NEGATIVE mg/dL
Nitrite: NEGATIVE
Protein, ur: NEGATIVE mg/dL
Specific Gravity, Urine: 1.024 (ref 1.005–1.030)
pH: 6 (ref 5.0–8.0)

## 2021-04-22 LAB — POCT PREGNANCY, URINE: Preg Test, Ur: POSITIVE — AB

## 2021-04-22 MED ORDER — LACTATED RINGERS IV BOLUS
1000.0000 mL | Freq: Once | INTRAVENOUS | Status: AC
  Administered 2021-04-23: 1000 mL via INTRAVENOUS

## 2021-04-22 MED ORDER — ONDANSETRON HCL 4 MG/2ML IJ SOLN
4.0000 mg | Freq: Once | INTRAMUSCULAR | Status: AC
  Administered 2021-04-23: 4 mg via INTRAVENOUS
  Filled 2021-04-22: qty 2

## 2021-04-22 MED ORDER — METOCLOPRAMIDE HCL 5 MG/ML IJ SOLN
10.0000 mg | Freq: Once | INTRAMUSCULAR | Status: AC
  Administered 2021-04-23: 10 mg via INTRAVENOUS
  Filled 2021-04-22: qty 2

## 2021-04-22 NOTE — MAU Note (Signed)
Pt stated she has had n/V since early yesterday and has not been able to keep anything down. Feels dehydrated and dizzy. Denies any pain.

## 2021-04-23 DIAGNOSIS — Z3A01 Less than 8 weeks gestation of pregnancy: Secondary | ICD-10-CM | POA: Diagnosis not present

## 2021-04-23 DIAGNOSIS — O210 Mild hyperemesis gravidarum: Secondary | ICD-10-CM

## 2021-04-23 LAB — CBC WITH DIFFERENTIAL/PLATELET
Abs Immature Granulocytes: 0.03 10*3/uL (ref 0.00–0.07)
Basophils Absolute: 0 10*3/uL (ref 0.0–0.1)
Basophils Relative: 0 %
Eosinophils Absolute: 0.1 10*3/uL (ref 0.0–0.5)
Eosinophils Relative: 1 %
HCT: 38.2 % (ref 36.0–46.0)
Hemoglobin: 11.9 g/dL — ABNORMAL LOW (ref 12.0–15.0)
Immature Granulocytes: 0 %
Lymphocytes Relative: 22 %
Lymphs Abs: 1.9 10*3/uL (ref 0.7–4.0)
MCH: 24.6 pg — ABNORMAL LOW (ref 26.0–34.0)
MCHC: 31.2 g/dL (ref 30.0–36.0)
MCV: 79.1 fL — ABNORMAL LOW (ref 80.0–100.0)
Monocytes Absolute: 0.6 10*3/uL (ref 0.1–1.0)
Monocytes Relative: 7 %
Neutro Abs: 6 10*3/uL (ref 1.7–7.7)
Neutrophils Relative %: 70 %
Platelets: 239 10*3/uL (ref 150–400)
RBC: 4.83 MIL/uL (ref 3.87–5.11)
RDW: 15.2 % (ref 11.5–15.5)
WBC: 8.7 10*3/uL (ref 4.0–10.5)
nRBC: 0 % (ref 0.0–0.2)

## 2021-04-23 LAB — COMPREHENSIVE METABOLIC PANEL
ALT: 39 U/L (ref 0–44)
AST: 26 U/L (ref 15–41)
Albumin: 3.6 g/dL (ref 3.5–5.0)
Alkaline Phosphatase: 81 U/L (ref 38–126)
Anion gap: 9 (ref 5–15)
BUN: 6 mg/dL (ref 6–20)
CO2: 23 mmol/L (ref 22–32)
Calcium: 9 mg/dL (ref 8.9–10.3)
Chloride: 102 mmol/L (ref 98–111)
Creatinine, Ser: 0.71 mg/dL (ref 0.44–1.00)
GFR, Estimated: >60 mL/min (ref >60)
Glucose, Bld: 86 mg/dL (ref 70–99)
Potassium: 3.5 mmol/L (ref 3.5–5.1)
Sodium: 134 mmol/L — ABNORMAL LOW (ref 135–145)
Total Bilirubin: 0.6 mg/dL (ref 0.3–1.2)
Total Protein: 7.2 g/dL (ref 6.5–8.1)

## 2021-04-23 MED ORDER — METOCLOPRAMIDE HCL 10 MG PO TABS: 10.0000 mg | ORAL_TABLET | Freq: Four times a day (QID) | ORAL | 2 refills | Status: DC

## 2021-04-23 MED ORDER — DOXYLAMINE-PYRIDOXINE 10-10 MG PO TBEC: 1.0000 | DELAYED_RELEASE_TABLET | Freq: Every day | ORAL | 2 refills | Status: DC

## 2021-04-23 NOTE — MAU Provider Note (Signed)
Chief Complaint:  Nausea and Emesis   Event Date/Time   First Provider Initiated Contact with Patient 04/22/21 2248     HPI: Maria Mercado is a 30 y.o. [id number] at [redacted]w[redacted]d who presents to maternity admissions reporting severe nausea/vomiting x2 days with inability to keep anything down. Starting to feel lightheaded and dizzy. Has a strong history of hyperemesis gravidarum, had it with both of her previous pregnancies. She is currently down 9lbs from her pre-pregnancy weight. Only had nausea in weeks 4-5.5, it has progressed to include vomiting. In the past she has needed round the clock zofran, phenergan and reglan to stay hydrated, but as she is currently being managed by [facility], could only get zofran prescribed. Last taken at 5:30pm (4mg  ODT). Wants to try diclegis because bongesta has worked in the past. No other physical complaints. Denies vaginal bleeding or discharge, no abdominal pain or cramping.  Pregnancy Course: Underwent IUI for this pregnancy, will start care with [facility] soon ([facility] delivered her first two children).  Past Medical History:  Diagnosis Date   Anxiety    Back pain    Depression    History of kidney stones    HS (hereditary spherocytosis) (HCC)    OB History  Gravida Para Term Preterm AB Living  4 2 2   1 2   SAB IAB Ectopic Multiple Live Births  1     0 2    # Outcome Date GA Lbr Len/2nd Weight Sex Delivery Anes PTL Lv  4 Current           3 Term 04/01/19 [redacted]w[redacted]d 10:23 / 00:12 8 lb 12 oz (3.969 kg) M Vag-Spont EPI  LIV  2 SAB 2018          1 Term 2012     Vag-Spont   LIV   Past Surgical History:  Procedure Laterality Date   CYST EXCISION     breast and axial    WISDOM TOOTH EXTRACTION     Family History  Problem Relation Age of Onset   Hypertension Mother    Hypertension Father    Stroke Maternal Grandfather    Heart attack Maternal Grandfather    Social History   Tobacco Use   Smoking status: Never   Smokeless  tobacco: Never  Vaping Use   Vaping Use: Never used  Substance Use Topics   Alcohol use: Not Currently    Comment: occ   Drug use: Never   Allergies  Allergen Reactions   Keflex [Cephalexin] Itching    Has taken Keflex but was okay, except for one generic brand that made her itch.    No medications prior to admission.   I have reviewed patient's Past Medical Hx, Surgical Hx, Family Hx, Social Hx, medications and allergies.   ROS:  Pertinent items noted in HPI and remainder of comprehensive ROS otherwise negative.  Physical Exam  Patient Vitals for the past 24 hrs:  BP Temp Pulse Resp Height Weight  04/23/21 0143 106/65 -- 91 -- -- --  04/22/21 2216 126/80 98.3 F (36.8 C) 88 18 5\' 5"  (1.651 m) 189 lb (85.7 kg)   Constitutional: Well-developed, well-nourished female in no acute distress (talkative, no emesis during MAU stay).  Cardiovascular: normal rate & rhythm, no murmur Respiratory: normal effort, lung sounds clear throughout GI: Abd soft, non-tender, gravid appropriate for gestational age. Pos BS x 4 MS: Extremities nontender, no edema, normal ROM Neurologic: Alert and oriented x 4.  GU: no CVA  tenderness Pelvic exam deferred   Labs: Results for orders placed or performed during the hospital encounter of 04/22/21 (from the past 24 hour(s))  Pregnancy, urine POC     Status: Abnormal   Collection Time: 04/22/21 10:22 PM  Result Value Ref Range   Preg Test, Ur POSITIVE (A) NEGATIVE  Urinalysis, Routine w reflex microscopic Urine, Clean Catch     Status: Abnormal   Collection Time: 04/22/21 10:31 PM  Result Value Ref Range   Color, Urine YELLOW YELLOW   APPearance CLOUDY (A) CLEAR   Specific Gravity, Urine 1.024 1.005 - 1.030   pH 6.0 5.0 - 8.0   Glucose, UA NEGATIVE NEGATIVE mg/dL   Hgb urine dipstick NEGATIVE NEGATIVE   Bilirubin Urine NEGATIVE NEGATIVE   Ketones, ur NEGATIVE NEGATIVE mg/dL   Protein, ur NEGATIVE NEGATIVE mg/dL   Nitrite NEGATIVE NEGATIVE    Leukocytes,Ua SMALL (A) NEGATIVE   RBC / HPF 0-5 0 - 5 RBC/hpf   WBC, UA 0-5 0 - 5 WBC/hpf   Bacteria, UA FEW (A) NONE SEEN   Squamous Epithelial / LPF 21-50 0 - 5   Mucus PRESENT    Amorphous Crystal PRESENT   CBC with Differential/Platelet     Status: Abnormal   Collection Time: 04/23/21 12:00 AM  Result Value Ref Range   WBC 8.7 4.0 - 10.5 K/uL   RBC 4.83 3.87 - 5.11 MIL/uL   Hemoglobin 11.9 (L) 12.0 - 15.0 g/dL   HCT 54.6 56.8 - 12.7 %   MCV 79.1 (L) 80.0 - 100.0 fL   MCH 24.6 (L) 26.0 - 34.0 pg   MCHC 31.2 30.0 - 36.0 g/dL   RDW 51.7 00.1 - 74.9 %   Platelets 239 150 - 400 K/uL   nRBC 0.0 0.0 - 0.2 %   Neutrophils Relative % 70 %   Neutro Abs 6.0 1.7 - 7.7 K/uL   Lymphocytes Relative 22 %   Lymphs Abs 1.9 0.7 - 4.0 K/uL   Monocytes Relative 7 %   Monocytes Absolute 0.6 0.1 - 1.0 K/uL   Eosinophils Relative 1 %   Eosinophils Absolute 0.1 0.0 - 0.5 K/uL   Basophils Relative 0 %   Basophils Absolute 0.0 0.0 - 0.1 K/uL   Immature Granulocytes 0 %   Abs Immature Granulocytes 0.03 0.00 - 0.07 K/uL  Comprehensive metabolic panel     Status: Abnormal   Collection Time: 04/23/21 12:00 AM  Result Value Ref Range   Sodium 134 (L) 135 - 145 mmol/L   Potassium 3.5 3.5 - 5.1 mmol/L   Chloride 102 98 - 111 mmol/L   CO2 23 22 - 32 mmol/L   Glucose, Bld 86 70 - 99 mg/dL   BUN 6 6 - 20 mg/dL   Creatinine, Ser 4.49 0.44 - 1.00 mg/dL   Calcium 9.0 8.9 - 67.5 mg/dL   Total Protein 7.2 6.5 - 8.1 g/dL   Albumin 3.6 3.5 - 5.0 g/dL   AST 26 15 - 41 U/L   ALT 39 0 - 44 U/L   Alkaline Phosphatase 81 38 - 126 U/L   Total Bilirubin 0.6 0.3 - 1.2 mg/dL   GFR, Estimated >91 >63 mL/min   Anion gap 9 5 - 15   Imaging:  No results found.  MAU Course: Orders Placed This Encounter  Procedures   Urinalysis, Routine w reflex microscopic Urine, Clean Catch   CBC with Differential/Platelet   Comprehensive metabolic panel   Pregnancy, urine POC   Discharge patient  Meds ordered this  encounter  Medications   lactated ringers bolus 1,000 mL   ondansetron (ZOFRAN) injection 4 mg   metoCLOPramide (REGLAN) injection 10 mg   Doxylamine-Pyridoxine 10-10 MG TBEC    Sig: Take 1-2 tablets by mouth at bedtime. Start by taking two tablets at bedtime on day 1 and 2; if symptoms persist, take 1 tablet in morning and 2 tablets at bedtime on day 3; if symptoms persist, may increase to 1 tablet in morning, 1 tablet mid-afternoon, and 2 tablets at bedtime on day 4 (maximum: doxylamine 40 mg/pyridoxine 40 mg (4 tablets) per day).    Dispense:  60 tablet    Refill:  2    Order Specific Question:   Supervising Provider    Answer:   Reva Bores [[id number]]   metoCLOPramide (REGLAN) 10 MG tablet    Sig: Take 1 tablet (10 mg total) by mouth 4 (four) times daily.    Dispense:  60 tablet    Refill:  2    Order Specific Question:   Supervising Provider    Answer:   Reva Bores [[id number]]   MDM: LR bolus + zofran and reglan IV ordered and tolerated well. Pt able to keep down crackers and juice.  Assessment: 1. Hyperemesis gravidarum    Plan: Discharge home in stable condition with return precautions.     Follow-up Information     [facility]. Schedule an appointment as soon as possible for a visit.   Why: for routine prenatal care Contact information: [address]  [address] [place] [address] [phone]                 Allergies as of 04/23/2021       Reactions   Keflex [cephalexin] Itching   Has taken Keflex but was okay, except for one generic brand that made her itch.         Medication List     TAKE these medications    acetaminophen 325 MG tablet Commonly known as: Tylenol Take 2 tablets (650 mg total) by mouth every 4 (four) hours as needed (for pain scale < 4).   Doxylamine-Pyridoxine 10-10 MG Tbec Take 1-2 tablets by mouth at bedtime. Start by taking two tablets at bedtime on day 1 and 2; if symptoms persist, take 1 tablet in  morning and 2 tablets at bedtime on day 3; if symptoms persist, may increase to 1 tablet in morning, 1 tablet mid-afternoon, and 2 tablets at bedtime on day 4 (maximum: doxylamine 40 mg/pyridoxine 40 mg (4 tablets) per day).   FLUoxetine 20 MG tablet Commonly known as: PROZAC Take 20 mg by mouth daily.   metoCLOPramide 10 MG tablet Commonly known as: Reglan Take 1 tablet (10 mg total) by mouth 4 (four) times daily.   ondansetron 4 MG disintegrating tablet Commonly known as: ZOFRAN-ODT Take by mouth.   promethazine 12.5 MG tablet Commonly known as: PHENERGAN Take 12.5 mg by mouth every 6 (six) hours as needed for nausea or vomiting.   Vitamin D (Ergocalciferol) 1.25 MG (50000 UNIT) Caps capsule Commonly known as: DRISDOL Take 50,000 Units by mouth once a week.       Edd Arbour, CNM, MSN, IBCLC Certified Nurse Midwife, [facility]

## 2021-04-23 NOTE — Discharge Instructions (Signed)
Start taking diclegis at night and stop taking zofran/phenergan. Add reglan if needed, then zofran, then phenergan (because it will make you the sleepiest combined with diclegis).  Ask your OB provider about weekly infusions with phenergan/lactated ringers through the local infusion clinic.

## 2021-05-06 ENCOUNTER — Other Ambulatory Visit: Payer: Self-pay

## 2021-05-06 ENCOUNTER — Encounter (HOSPITAL_COMMUNITY): Payer: Self-pay | Admitting: Obstetrics and Gynecology

## 2021-05-06 ENCOUNTER — Inpatient Hospital Stay (HOSPITAL_COMMUNITY)
Admission: AD | Admit: 2021-05-06 | Discharge: 2021-05-06 | Disposition: A | Discharge status: Home / Self Care | Attending: Obstetrics and Gynecology | Admitting: Obstetrics and Gynecology

## 2021-05-06 DIAGNOSIS — O210 Mild hyperemesis gravidarum: Secondary | ICD-10-CM | POA: Diagnosis present

## 2021-05-06 DIAGNOSIS — Z79899 Other long term (current) drug therapy: Secondary | ICD-10-CM | POA: Diagnosis not present

## 2021-05-06 DIAGNOSIS — Z3A00 Weeks of gestation of pregnancy not specified: Secondary | ICD-10-CM | POA: Diagnosis not present

## 2021-05-06 DIAGNOSIS — O99340 Other mental disorders complicating pregnancy, unspecified trimester: Secondary | ICD-10-CM | POA: Diagnosis not present

## 2021-05-06 DIAGNOSIS — F32A Depression, unspecified: Secondary | ICD-10-CM | POA: Insufficient documentation

## 2021-05-06 DIAGNOSIS — Z3A08 8 weeks gestation of pregnancy: Secondary | ICD-10-CM | POA: Diagnosis not present

## 2021-05-06 DIAGNOSIS — F419 Anxiety disorder, unspecified: Secondary | ICD-10-CM | POA: Insufficient documentation

## 2021-05-06 LAB — URINALYSIS, MICROSCOPIC (REFLEX)

## 2021-05-06 LAB — CBC
HCT: 41.2 % (ref 36.0–46.0)
Hemoglobin: 13 g/dL (ref 12.0–15.0)
MCH: 24.5 pg — ABNORMAL LOW (ref 26.0–34.0)
MCHC: 31.6 g/dL (ref 30.0–36.0)
MCV: 77.7 fL — ABNORMAL LOW (ref 80.0–100.0)
Platelets: 319 10*3/uL (ref 150–400)
RBC: 5.3 MIL/uL — ABNORMAL HIGH (ref 3.87–5.11)
RDW: 15.7 % — ABNORMAL HIGH (ref 11.5–15.5)
WBC: 9.5 10*3/uL (ref 4.0–10.5)
nRBC: 0 % (ref 0.0–0.2)

## 2021-05-06 LAB — COMPREHENSIVE METABOLIC PANEL
ALT: 27 U/L (ref 0–44)
AST: 21 U/L (ref 15–41)
Albumin: 3.7 g/dL (ref 3.5–5.0)
Alkaline Phosphatase: 85 U/L (ref 38–126)
Anion gap: 10 (ref 5–15)
BUN: 8 mg/dL (ref 6–20)
CO2: 21 mmol/L — ABNORMAL LOW (ref 22–32)
Calcium: 9.3 mg/dL (ref 8.9–10.3)
Chloride: 103 mmol/L (ref 98–111)
Creatinine, Ser: 0.51 mg/dL (ref 0.44–1.00)
GFR, Estimated: >60 mL/min (ref >60)
Glucose, Bld: 86 mg/dL (ref 70–99)
Potassium: 3.6 mmol/L (ref 3.5–5.1)
Sodium: 134 mmol/L — ABNORMAL LOW (ref 135–145)
Total Bilirubin: 0.9 mg/dL (ref 0.3–1.2)
Total Protein: 7.7 g/dL (ref 6.5–8.1)

## 2021-05-06 LAB — URINALYSIS, ROUTINE W REFLEX MICROSCOPIC
Glucose, UA: 100 mg/dL — AB
Hgb urine dipstick: NEGATIVE
Ketones, ur: 40 mg/dL — AB
Nitrite: NEGATIVE
Protein, ur: 100 mg/dL — AB
Specific Gravity, Urine: >1.03 — ABNORMAL HIGH (ref 1.005–1.030)
pH: 6.5 (ref 5.0–8.0)

## 2021-05-06 MED ORDER — ONDANSETRON 8 MG PO TBDP: 8.0000 mg | ORAL_TABLET | Freq: Three times a day (TID) | ORAL | 0 refills | Status: DC | PRN

## 2021-05-06 MED ORDER — SCOPOLAMINE 1 MG/3DAYS TD PT72: 1.0000 | MEDICATED_PATCH | TRANSDERMAL | Status: DC

## 2021-05-06 MED ORDER — SCOPOLAMINE 1 MG/3DAYS TD PT72: 1.0000 | MEDICATED_PATCH | TRANSDERMAL | 12 refills | Status: DC

## 2021-05-06 MED ORDER — PROMETHAZINE HCL 25 MG PO TABS: 25.0000 mg | ORAL_TABLET | Freq: Four times a day (QID) | ORAL | Status: DC | PRN

## 2021-05-06 MED ORDER — SCOPOLAMINE 1 MG/3DAYS TD PT72
1.0000 | MEDICATED_PATCH | TRANSDERMAL | Status: DC
  Administered 2021-05-06: 1.5 mg via TRANSDERMAL
  Filled 2021-05-06: qty 1

## 2021-05-06 MED ORDER — PROMETHAZINE HCL 12.5 MG PO TABS: 12.5000 mg | ORAL_TABLET | Freq: Four times a day (QID) | ORAL | 1 refills | Status: DC | PRN

## 2021-05-06 MED ORDER — ONDANSETRON 4 MG PO TBDP
8.0000 mg | ORAL_TABLET | Freq: Once | ORAL | Status: AC
  Administered 2021-05-06: 8 mg via ORAL
  Filled 2021-05-06: qty 2

## 2021-05-06 MED ORDER — LACTATED RINGERS IV BOLUS
1000.0000 mL | Freq: Once | INTRAVENOUS | Status: AC
  Administered 2021-05-06: 1000 mL via INTRAVENOUS

## 2021-05-06 MED ORDER — SODIUM CHLORIDE 0.9 % IV SOLN
25.0000 mg | Freq: Once | INTRAVENOUS | Status: AC
  Administered 2021-05-06: 25 mg via INTRAVENOUS
  Filled 2021-05-06: qty 1

## 2021-05-06 NOTE — MAU Note (Signed)
Sadira Standard is a 30 y.o. at [redacted]w[redacted]d here in MAU reporting: nausea and vomiting for the past 72 hours. Has not been able to urinate. Emesis x 4-5 in the past 24 hours. No pain, bleeding, or discharge.   Last took zofran and reglan today around 1300, ran out of phenergan, and took unisom and vit b6 last night.  Onset of complaint: ongoing  Pain score: 0/10  Vitals:   05/06/21 1707  BP: 128/81  Pulse: 97  Resp: 16  Temp: 98.5 F (36.9 C)  SpO2: 99%     Lab orders placed from triage: UA

## 2021-05-06 NOTE — MAU Provider Note (Signed)
Patient Maria Mercado is a 30 y.o. [phone]  At [redacted]w[redacted]d here with complaints of nausea and vomiting that has been on-going. She was seen in MAU on 04/23/2021 and given antiemetics that worked for two weeks.  However, the nausea got worse on Friday (4 days ago) and then she started vomiting on Saturday. She is out of phenergan. She was taking phenergan, zofran, reglan, B6 and Unisom. However, she thinks she "overdid it" last week and then things got worse.   She denies vaginal bleeding, cramping, dysuria, fever, SOB, diarrhea, constipation.  History     CSN: [id number]  Arrival date and time: 05/06/21 1651   Event Date/Time   First Provider Initiated Contact with Patient 05/06/21 1903      Chief Complaint  Patient presents with   Nausea   Emesis   Emesis  This is a chronic problem. The current episode started in the past 7 days. The problem occurs 2 to 4 times per day. The emesis has an appearance of stomach contents and bile. There has been no fever. Associated symptoms include a fever and weight loss. Pertinent negatives include no chest pain, chills or diarrhea.   OB History     Gravida  4   Para  2   Term  2   Preterm      AB  1   Living  2      SAB  1   IAB      Ectopic      Multiple  0   Live Births  2           Past Medical History:  Diagnosis Date   Anxiety    Back pain    Depression    History of kidney stones    HS (hereditary spherocytosis) (HCC)     Past Surgical History:  Procedure Laterality Date   CYST EXCISION     breast and axial    WISDOM TOOTH EXTRACTION      Family History  Problem Relation Age of Onset   Hypertension Mother    Hypertension Father    Stroke Maternal Grandfather    Heart attack Maternal Grandfather     Social History   Tobacco Use   Smoking status: Never   Smokeless tobacco: Never  Vaping Use   Vaping Use: Never used  Substance Use Topics   Alcohol use: Not Currently    Comment: occ   Drug use:  Never    Allergies:  Allergies  Allergen Reactions   Keflex [Cephalexin] Itching    Has taken Keflex but was okay, except for one generic brand that made her itch.     Medications Prior to Admission  Medication Sig Dispense Refill Last Dose   Doxylamine-Pyridoxine 10-10 MG TBEC Take 1-2 tablets by mouth at bedtime. Start by taking two tablets at bedtime on day 1 and 2; if symptoms persist, take 1 tablet in morning and 2 tablets at bedtime on day 3; if symptoms persist, may increase to 1 tablet in morning, 1 tablet mid-afternoon, and 2 tablets at bedtime on day 4 (maximum: doxylamine 40 mg/pyridoxine 40 mg (4 tablets) per day). 60 tablet 2 05/05/2021   metoCLOPramide (REGLAN) 10 MG tablet Take 1 tablet (10 mg total) by mouth 4 (four) times daily. 60 tablet 2 05/06/2021   ondansetron (ZOFRAN-ODT) 4 MG disintegrating tablet Take by mouth.   05/06/2021   acetaminophen (TYLENOL) 325 MG tablet Take 2 tablets (650 mg total) by mouth every 4 (four)  hours as needed (for pain scale < 4). 30 tablet 1    FLUoxetine (PROZAC) 20 MG tablet Take 20 mg by mouth daily.       promethazine (PHENERGAN) 12.5 MG tablet Take 12.5 mg by mouth every 6 (six) hours as needed for nausea or vomiting.      Vitamin D, Ergocalciferol, (DRISDOL) 1.25 MG (50000 UNIT) CAPS capsule Take 50,000 Units by mouth once a week.       Review of Systems  Constitutional:  Positive for fever and weight loss. Negative for chills.  Respiratory: Negative.    Cardiovascular:  Negative for chest pain.  Gastrointestinal:  Positive for vomiting. Negative for diarrhea.  Genitourinary:  Positive for decreased urine volume. Negative for vaginal bleeding.  Neurological: Negative.   Hematological: Negative.   Psychiatric/Behavioral: Negative.    Physical Exam   Blood pressure 128/81, pulse 97, temperature 98.5 F (36.9 C), temperature source Oral, resp. rate 16, height 5\' 5"  (1.651 m), weight 82.8 kg, last menstrual period 03/08/2021, SpO2 99 %,  unknown if currently breastfeeding.  Physical Exam Constitutional:      Appearance: Normal appearance.  Cardiovascular:     Rate and Rhythm: Normal rate.     Pulses: Normal pulses.  Musculoskeletal:        General: Normal range of motion.  Skin:    General: Skin is warm.  Neurological:     Mental Status: She is alert.    MAU Course  Procedures  MDM -CMP is normal; weight is stable from previous visit -patient had zofran, phenergan bag and scop patch.   2150: Patient attempting to eat crackers and ginger ale. Patient alert and oriented times 3; in no distress.   22:10: Patient feels well, reports complete resolution of her symptoms and currently has no NV  Assessment and Plan   1. Hyperemesis gravidarum   -patient discharged home with RX for antiemetics and scop patch -OB urine culture sent -return to MAU if symptoms worsen or change -all questions answered  2151 Juliocesar Blasius 05/06/2021, 7:06 PM

## 2021-05-08 LAB — CULTURE, OB URINE: Culture: >=100000 — AB

## 2021-06-05 LAB — OB RESULTS CONSOLE RPR
RPR: NONREACTIVE
RPR: NONREACTIVE

## 2021-06-05 LAB — OB RESULTS CONSOLE HIV ANTIBODY (ROUTINE TESTING)
HIV: NONREACTIVE
HIV: NONREACTIVE

## 2021-06-05 LAB — OB RESULTS CONSOLE GC/CHLAMYDIA
Chlamydia: NEGATIVE
Gonorrhea: NEGATIVE

## 2021-06-05 LAB — OB RESULTS CONSOLE HEPATITIS B SURFACE ANTIGEN
Hepatitis B Surface Ag: NEGATIVE
Hepatitis B Surface Ag: NEGATIVE

## 2021-06-05 LAB — OB RESULTS CONSOLE RUBELLA ANTIBODY, IGM
Rubella: IMMUNE
Rubella: IMMUNE

## 2021-06-05 LAB — HEPATITIS C ANTIBODY: HCV Ab: NEGATIVE

## 2021-06-11 ENCOUNTER — Other Ambulatory Visit: Payer: Self-pay

## 2021-06-11 ENCOUNTER — Non-Acute Institutional Stay (HOSPITAL_COMMUNITY)
Admission: RE | Admit: 2021-06-11 | Discharge: 2021-06-11 | Disposition: A | Discharge status: Home / Self Care | Source: Ambulatory Visit | Attending: Internal Medicine | Admitting: Internal Medicine

## 2021-06-11 DIAGNOSIS — R1110 Vomiting, unspecified: Secondary | ICD-10-CM | POA: Insufficient documentation

## 2021-06-11 MED ORDER — LACTATED RINGERS IV BOLUS
1000.0000 mL | Freq: Once | INTRAVENOUS | Status: AC
  Administered 2021-06-11: 1000 mL via INTRAVENOUS

## 2021-06-11 MED ORDER — M.V.I. ADULT IV INJ
Freq: Once | INTRAVENOUS | Status: AC
  Filled 2021-06-11: qty 5

## 2021-06-11 MED ORDER — ONDANSETRON HCL 4 MG/2ML IJ SOLN
4.0000 mg | Freq: Once | INTRAMUSCULAR | Status: AC
  Administered 2021-06-11: 4 mg via INTRAVENOUS
  Filled 2021-06-11: qty 2

## 2021-06-11 NOTE — Progress Notes (Signed)
PATIENT CARE CENTER NOTE   Diagnosis: Hyperemesis 021.1   Provider: Huel Cote, MD   Procedure: IV fluid hydration and nausea med   Note:  Patient received 1 liter LR over 1 hour  and 1 liter D5LR with multivitamins over 2 hours via PIV. Zofran 4 mg IV also given. Patient tolerated infusions well. Vital signs stable. AVS offered but patient refused. Patient alert, oriented and ambulatory at discharge.

## 2021-06-18 ENCOUNTER — Non-Acute Institutional Stay (HOSPITAL_COMMUNITY)
Admission: RE | Admit: 2021-06-18 | Discharge: 2021-06-18 | Disposition: A | Discharge status: Home / Self Care | Source: Ambulatory Visit | Attending: Internal Medicine | Admitting: Internal Medicine

## 2021-06-18 ENCOUNTER — Other Ambulatory Visit: Payer: Self-pay

## 2021-06-18 DIAGNOSIS — R1110 Vomiting, unspecified: Secondary | ICD-10-CM | POA: Diagnosis present

## 2021-06-18 MED ORDER — LACTATED RINGERS IV BOLUS
1000.0000 mL | Freq: Once | INTRAVENOUS | Status: AC
  Administered 2021-06-18: 1000 mL via INTRAVENOUS

## 2021-06-18 MED ORDER — M.V.I. ADULT IV INJ
Freq: Once | INTRAVENOUS | Status: AC
  Filled 2021-06-18: qty 10

## 2021-06-18 MED ORDER — ONDANSETRON HCL 4 MG/2ML IJ SOLN
4.0000 mg | Freq: Once | INTRAMUSCULAR | Status: AC
  Administered 2021-06-18: 4 mg via INTRAVENOUS
  Filled 2021-06-18: qty 2

## 2021-06-18 NOTE — Progress Notes (Signed)
PATIENT CARE CENTER NOTE     Diagnosis: Hyperemesis     Provider: Huel Cote, MD     Procedure: IV fluid hydration and nausea med     Note:  Patient received 1 liter LR over 1 hour  and 1 liter D5LR with multivitamins over 2 hours via PIV. Zofran 4 mg IV also given. Patient tolerated infusions well. Vital signs stable. AVS offered but patient refused. Patient alert, oriented and ambulatory at discharge.

## 2021-06-25 ENCOUNTER — Other Ambulatory Visit: Payer: Self-pay

## 2021-06-25 ENCOUNTER — Encounter (HOSPITAL_COMMUNITY)

## 2021-06-25 ENCOUNTER — Non-Acute Institutional Stay (HOSPITAL_COMMUNITY)
Admission: RE | Admit: 2021-06-25 | Discharge: 2021-06-25 | Disposition: A | Discharge status: Home / Self Care | Source: Ambulatory Visit | Attending: Internal Medicine | Admitting: Internal Medicine

## 2021-06-25 DIAGNOSIS — O211 Hyperemesis gravidarum with metabolic disturbance: Secondary | ICD-10-CM | POA: Diagnosis present

## 2021-06-25 MED ORDER — M.V.I. ADULT IV INJ
Freq: Once | INTRAVENOUS | Status: AC
  Filled 2021-06-25: qty 10

## 2021-06-25 MED ORDER — LACTATED RINGERS IV SOLN: Freq: Once | INTRAVENOUS | Status: AC

## 2021-06-25 MED ORDER — ONDANSETRON HCL 4 MG/2ML IJ SOLN
4.0000 mg | Freq: Once | INTRAMUSCULAR | Status: AC
  Administered 2021-06-25: 4 mg via INTRAVENOUS
  Filled 2021-06-25: qty 2

## 2021-06-25 NOTE — Progress Notes (Signed)
PATIENT CARE CENTER NOTE     Diagnosis: Hyperemesis      Provider: Huel Cote, MD     Procedure: IV fluid hydration and nausea med     Note:  Patient received 1 liter LR over 1 hour  and 1 liter D5LR with multivitamins over 2 hours via PIV. Zofran 4 mg IV also given. Pt c/o acid reflux,  attempted to contact Dr. Senaida Ores to place order for Pepcid; however unable to contact MD via Epic secure chat. Pt states she has her OB visit this afternoon and will discuss this issue with her at that time. Patient tolerated infusions well. Vital signs stable. AVS offered but patient refused. Pt to RTC 10/25, verbalized understanding. Patient alert, oriented and ambulatory at discharge.

## 2021-07-02 ENCOUNTER — Encounter (HOSPITAL_COMMUNITY)

## 2021-08-22 ENCOUNTER — Other Ambulatory Visit: Payer: Self-pay

## 2021-08-22 ENCOUNTER — Inpatient Hospital Stay (HOSPITAL_COMMUNITY)
Admission: AD | Admit: 2021-08-22 | Discharge: 2021-08-22 | Disposition: A | Discharge status: Home / Self Care | Attending: Obstetrics | Admitting: Obstetrics

## 2021-08-22 ENCOUNTER — Encounter (HOSPITAL_COMMUNITY): Payer: Self-pay | Admitting: Obstetrics

## 2021-08-22 DIAGNOSIS — O26892 Other specified pregnancy related conditions, second trimester: Secondary | ICD-10-CM | POA: Diagnosis present

## 2021-08-22 DIAGNOSIS — R42 Dizziness and giddiness: Secondary | ICD-10-CM | POA: Insufficient documentation

## 2021-08-22 DIAGNOSIS — R051 Acute cough: Secondary | ICD-10-CM | POA: Insufficient documentation

## 2021-08-22 DIAGNOSIS — Z3A23 23 weeks gestation of pregnancy: Secondary | ICD-10-CM | POA: Diagnosis not present

## 2021-08-22 DIAGNOSIS — U071 COVID-19: Secondary | ICD-10-CM | POA: Diagnosis not present

## 2021-08-22 DIAGNOSIS — R6883 Chills (without fever): Secondary | ICD-10-CM | POA: Diagnosis not present

## 2021-08-22 DIAGNOSIS — O98512 Other viral diseases complicating pregnancy, second trimester: Secondary | ICD-10-CM | POA: Diagnosis not present

## 2021-08-22 DIAGNOSIS — R000 Tachycardia, unspecified: Secondary | ICD-10-CM | POA: Insufficient documentation

## 2021-08-22 LAB — URINALYSIS, ROUTINE W REFLEX MICROSCOPIC
Bilirubin Urine: NEGATIVE
Glucose, UA: NEGATIVE mg/dL
Hgb urine dipstick: NEGATIVE
Ketones, ur: NEGATIVE mg/dL
Leukocytes,Ua: NEGATIVE
Nitrite: NEGATIVE
Protein, ur: NEGATIVE mg/dL
Specific Gravity, Urine: 1.015 (ref 1.005–1.030)
pH: 7 (ref 5.0–8.0)

## 2021-08-22 MED ORDER — BENZONATATE 100 MG PO CAPS: 200.0000 mg | ORAL_CAPSULE | Freq: Three times a day (TID) | ORAL | 0 refills | Status: DC | PRN

## 2021-08-22 MED ORDER — GUAIFENESIN ER 600 MG PO TB12: 1200.0000 mg | ORAL_TABLET | Freq: Two times a day (BID) | ORAL | 0 refills | Status: DC

## 2021-08-22 MED ORDER — ALBUTEROL SULFATE HFA 108 (90 BASE) MCG/ACT IN AERS: 2.0000 | INHALATION_SPRAY | RESPIRATORY_TRACT | 0 refills | Status: AC | PRN

## 2021-08-22 MED ORDER — ALBUTEROL SULFATE HFA 108 (90 BASE) MCG/ACT IN AERS
2.0000 | INHALATION_SPRAY | RESPIRATORY_TRACT | Status: DC | PRN
  Administered 2021-08-22: 2 via RESPIRATORY_TRACT
  Filled 2021-08-22: qty 6.7

## 2021-08-22 NOTE — MAU Note (Signed)
Presents stating sent from PCP office secondary increased pulse, reports pulse was 157 @ office.  Also reports was diagnosed with Covid today.  Denies VB or LOF.  Endorses +FM.

## 2021-08-22 NOTE — MAU Provider Note (Addendum)
Chief Complaint:  Covid + Other and Increased Pulse Rate   Event Date/Time   First Provider Initiated Contact with Patient 08/22/21 2130      HPI: Maria Mercado is a 30 y.o. [id number] at [redacted]w[redacted]d who presents to maternity admissions reporting cough, positive COVID test today and elevated heart rate at her primary care office today. She reports onset of respiratory symptoms with cough 2 days ago.  She had an episode of coughing at home today, became light headed, and had n/v. She went to see primary care at the [facility] today after this episode and had positive COVID test.  Her heartrate was 157 in the office so she was sent to MAU for further evaluation. She reports shortness of breath only when coughing. There is mild h/a and chills but no fever She reports good fetal movement.   HPI  Past Medical History: Past Medical History:  Diagnosis Date   Anxiety    Back pain    Depression    History of kidney stones    HS (hereditary spherocytosis) (HCC)     Past obstetric history: OB History  Gravida Para Term Preterm AB Living  4 2 2   1 2   SAB IAB Ectopic Multiple Live Births  1     0 2    # Outcome Date GA Lbr Len/2nd Weight Sex Delivery Anes PTL Lv  4 Current           3 Term 04/01/19 [redacted]w[redacted]d 10:23 / 00:12 3969 g M Vag-Spont EPI  LIV  2 SAB 2018          1 Term 2012     Vag-Spont   LIV    Past Surgical History: Past Surgical History:  Procedure Laterality Date   CYST EXCISION     breast and axial    WISDOM TOOTH EXTRACTION      Family History: Family History  Problem Relation Age of Onset   Hypertension Mother    Hypertension Father    Stroke Maternal Grandfather    Heart attack Maternal Grandfather     Social History: Social History   Tobacco Use   Smoking status: Never   Smokeless tobacco: Never  Vaping Use   Vaping Use: Never used  Substance Use Topics   Alcohol use: Not Currently    Comment: occ   Drug use: Never    Allergies:  Allergies  Allergen Reactions    Keflex [Cephalexin] Itching    Has taken Keflex but was okay, except for one generic brand that made her itch.     Meds:  Medications Prior to Admission  Medication Sig Dispense Refill Last Dose   ondansetron (ZOFRAN ODT) 8 MG disintegrating tablet Take 1 tablet (8 mg total) by mouth every 8 (eight) hours as needed for nausea or vomiting. 90 tablet 0 08/22/2021   acetaminophen (TYLENOL) 325 MG tablet Take 2 tablets (650 mg total) by mouth every 4 (four) hours as needed (for pain scale < 4). 30 tablet 1    Doxylamine-Pyridoxine 10-10 MG TBEC Take 1-2 tablets by mouth at bedtime. Start by taking two tablets at bedtime on day 1 and 2; if symptoms persist, take 1 tablet in morning and 2 tablets at bedtime on day 3; if symptoms persist, may increase to 1 tablet in morning, 1 tablet mid-afternoon, and 2 tablets at bedtime on day 4 (maximum: doxylamine 40 mg/pyridoxine 40 mg (4 tablets) per day). 60 tablet 2    FLUoxetine (PROZAC) 20 MG tablet  Take 20 mg by mouth daily.       metoCLOPramide (REGLAN) 10 MG tablet Take 1 tablet (10 mg total) by mouth 4 (four) times daily. 60 tablet 2    promethazine (PHENERGAN) 12.5 MG tablet Take 1 tablet (12.5 mg total) by mouth every 6 (six) hours as needed for nausea or vomiting. 30 tablet 1    scopolamine (TRANSDERM-SCOP) 1 MG/3DAYS Place 1 patch (1.5 mg total) onto the skin every 3 (three) days. 10 patch 12    Vitamin D, Ergocalciferol, (DRISDOL) 1.25 MG (50000 UNIT) CAPS capsule Take 50,000 Units by mouth once a week.       ROS:  Review of Systems  Constitutional:  Positive for chills. Negative for fatigue and fever.  Eyes:  Negative for visual disturbance.  Respiratory:  Positive for cough. Negative for chest tightness and shortness of breath.   Cardiovascular:  Negative for chest pain.  Gastrointestinal:  Negative for abdominal pain, nausea and vomiting.  Genitourinary:  Negative for difficulty urinating, dysuria, flank pain, pelvic pain, vaginal bleeding,  vaginal discharge and vaginal pain.  Neurological:  Positive for headaches. Negative for dizziness.  Psychiatric/Behavioral: Negative.      I have reviewed patient's Past Medical Hx, Surgical Hx, Family Hx, Social Hx, medications and allergies.   Physical Exam  Patient Vitals for the past 24 hrs:  BP Temp Temp src Pulse Resp SpO2  08/22/21 1939 -- -- -- (!) 105 -- --  08/22/21 1915 -- -- -- -- -- 100 %  08/22/21 1900 -- -- -- -- -- 100 %  08/22/21 1843 -- -- -- (!) 121 -- --  08/22/21 1842 124/80 -- -- (!) 129 -- --  08/22/21 1839 -- 98.6 F (37 C) Oral -- 12 98 %   Constitutional: Well-developed, well-nourished female in no acute distress.  HEART: normal rate, heart sounds, regular rhythm RESP: normal effort, lung sounds clear and equal bilaterally GI: Abd soft, non-tender, gravid appropriate for gestational age.  MS: Extremities nontender, no edema, normal ROM Neurologic: Alert and oriented x 4.  GU: Neg CVAT.  PELVIC EXAM: Deferred     FHT:  Baseline 150 , moderate variability, accelerations present, no decelerations Contractions: none on toco or to palpation   Labs: Results for orders placed or performed during the hospital encounter of 08/22/21 (from the past 24 hour(s))  Urinalysis, Routine w reflex microscopic Urine, Clean Catch     Status: None   Collection Time: 08/22/21  6:43 PM  Result Value Ref Range   Color, Urine YELLOW YELLOW   APPearance CLEAR CLEAR   Specific Gravity, Urine 1.015 1.005 - 1.030   pH 7.0 5.0 - 8.0   Glucose, UA NEGATIVE NEGATIVE mg/dL   Hgb urine dipstick NEGATIVE NEGATIVE   Bilirubin Urine NEGATIVE NEGATIVE   Ketones, ur NEGATIVE NEGATIVE mg/dL   Protein, ur NEGATIVE NEGATIVE mg/dL   Nitrite NEGATIVE NEGATIVE   Leukocytes,Ua NEGATIVE NEGATIVE      Imaging:  No results found.  MAU Course/MDM: Orders Placed This Encounter  Procedures   Urinalysis, Routine w reflex microscopic Urine, Clean Catch    Meds ordered this encounter   Medications   albuterol (VENTOLIN HFA) 108 (90 Base) MCG/ACT inhaler 2 puff   guaiFENesin (MUCINEX) 600 MG 12 hr tablet    Sig: Take 2 tablets (1,200 mg total) by mouth 2 (two) times daily.    Dispense:  12 tablet    Refill:  0    Order Specific Question:   Supervising Provider  Answer:   ERVIN, MICHAEL L [[id number]]   benzonatate (TESSALON) 100 MG capsule    Sig: Take 2 capsules (200 mg total) by mouth 3 (three) times daily as needed for cough.    Dispense:  21 capsule    Refill:  0    Order Specific Question:   Supervising Provider    Answer:   ERVIN, MICHAEL L [[id number]]   albuterol (VENTOLIN HFA) 108 (90 Base) MCG/ACT inhaler    Sig: Inhale 2 puffs into the lungs every 4 (four) hours as needed for wheezing or shortness of breath.    Dispense:  1 each    Refill:  0    Order Specific Question:   Supervising Provider    Answer:   Arlina Robes L [[id number]]     NST reviewed and appropriate for gestational age Pt with normal maternal pulse in MAU, highest 120s when up to the bathroom Pt with no acute COVID symptoms Rest, PO hydrate, offered Rx for Mucinex, Tessalon Perles, and albuterol inhaler PRN Pt to return to MAU if shortness of breath or chest pain, or fever not well managed with PO Tylenol COVID precautions reviewed Pt unable to pick up prescriptions tonight but she has Mucinex and Tessalon Perles at home so inhaler ordered x 1 dose in MAU for pt to take home Pt discharge with strict return precautions.    Assessment: 1. COVID-19 affecting pregnancy in second trimester   2. Acute cough   3. [redacted] weeks gestation of pregnancy     Plan: Discharge home Labor precautions and fetal kick counts  Follow-up Information     [facility] Ob[facility]Gyn Follow up.   Why: As scheduled Contact information: [address]  [address] [place] [address] [phone]         [facility] Follow up.   Specialty: Obstetrics and Gynecology Why: As needed for  emergencies, If symptoms worsen Contact information: [address] [id number] [place] [phone]               Allergies as of 08/22/2021       Reactions   Keflex [cephalexin] Itching   Has taken Keflex but was okay, except for one generic brand that made her itch.         Medication List     TAKE these medications    acetaminophen 325 MG tablet Commonly known as: Tylenol Take 2 tablets (650 mg total) by mouth every 4 (four) hours as needed (for pain scale < 4).   albuterol 108 (90 Base) MCG/ACT inhaler Commonly known as: VENTOLIN HFA Inhale 2 puffs into the lungs every 4 (four) hours as needed for wheezing or shortness of breath.   benzonatate 100 MG capsule Commonly known as: TESSALON Take 2 capsules (200 mg total) by mouth 3 (three) times daily as needed for cough.   Doxylamine-Pyridoxine 10-10 MG Tbec Take 1-2 tablets by mouth at bedtime. Start by taking two tablets at bedtime on day 1 and 2; if symptoms persist, take 1 tablet in morning and 2 tablets at bedtime on day 3; if symptoms persist, may increase to 1 tablet in morning, 1 tablet mid-afternoon, and 2 tablets at bedtime on day 4 (maximum: doxylamine 40 mg/pyridoxine 40 mg (4 tablets) per day).   FLUoxetine 20 MG tablet Commonly known as: PROZAC Take 20 mg by mouth daily.   guaiFENesin 600 MG 12 hr tablet Commonly known as: Mucinex Take 2 tablets (1,200 mg total)  by mouth 2 (two) times daily.   metoCLOPramide 10 MG tablet Commonly known as: Reglan Take 1 tablet (10 mg total) by mouth 4 (four) times daily.   ondansetron 8 MG disintegrating tablet Commonly known as: Zofran ODT Take 1 tablet (8 mg total) by mouth every 8 (eight) hours as needed for nausea or vomiting.   promethazine 12.5 MG tablet Commonly known as: PHENERGAN Take 1 tablet (12.5 mg total) by mouth every 6 (six) hours as needed for nausea or vomiting.   scopolamine 1 MG/3DAYS Commonly known  as: TRANSDERM-SCOP Place 1 patch (1.5 mg total) onto the skin every 3 (three) days.   Vitamin D (Ergocalciferol) 1.25 MG (50000 UNIT) Caps capsule Commonly known as: DRISDOL Take 50,000 Units by mouth once a week.        Fatima Blank Certified Nurse-Midwife 08/22/2021 10:05 PM

## 2021-11-12 LAB — OB RESULTS CONSOLE GBS: GBS: NEGATIVE

## 2021-11-23 ENCOUNTER — Inpatient Hospital Stay (HOSPITAL_COMMUNITY)

## 2021-11-23 ENCOUNTER — Other Ambulatory Visit: Payer: Self-pay

## 2021-11-23 ENCOUNTER — Inpatient Hospital Stay (HOSPITAL_COMMUNITY)
Admission: AD | Admit: 2021-11-23 | Discharge: 2021-11-23 | Disposition: A | Discharge status: Home / Self Care | Attending: Obstetrics and Gynecology | Admitting: Obstetrics and Gynecology

## 2021-11-23 ENCOUNTER — Encounter (HOSPITAL_COMMUNITY): Payer: Self-pay | Admitting: Obstetrics and Gynecology

## 2021-11-23 DIAGNOSIS — Z3A37 37 weeks gestation of pregnancy: Secondary | ICD-10-CM | POA: Diagnosis not present

## 2021-11-23 DIAGNOSIS — N200 Calculus of kidney: Secondary | ICD-10-CM | POA: Insufficient documentation

## 2021-11-23 DIAGNOSIS — R1012 Left upper quadrant pain: Secondary | ICD-10-CM | POA: Insufficient documentation

## 2021-11-23 DIAGNOSIS — O26893 Other specified pregnancy related conditions, third trimester: Secondary | ICD-10-CM | POA: Diagnosis not present

## 2021-11-23 LAB — CBC WITH DIFFERENTIAL/PLATELET
Abs Immature Granulocytes: 0.25 K/uL — ABNORMAL HIGH (ref 0.00–0.07)
Basophils Absolute: 0 K/uL (ref 0.0–0.1)
Basophils Relative: 0 %
Eosinophils Absolute: 0 K/uL (ref 0.0–0.5)
Eosinophils Relative: 0 %
HCT: 30 % — ABNORMAL LOW (ref 36.0–46.0)
Hemoglobin: 8.4 g/dL — ABNORMAL LOW (ref 12.0–15.0)
Immature Granulocytes: 2 %
Lymphocytes Relative: 10 %
Lymphs Abs: 1.5 K/uL (ref 0.7–4.0)
MCH: 20.5 pg — ABNORMAL LOW (ref 26.0–34.0)
MCHC: 28 g/dL — ABNORMAL LOW (ref 30.0–36.0)
MCV: 73.3 fL — ABNORMAL LOW (ref 80.0–100.0)
Monocytes Absolute: 0.9 K/uL (ref 0.1–1.0)
Monocytes Relative: 6 %
Neutro Abs: 12.8 K/uL — ABNORMAL HIGH (ref 1.7–7.7)
Neutrophils Relative %: 82 %
Platelets: 349 K/uL (ref 150–400)
RBC: 4.09 MIL/uL (ref 3.87–5.11)
RDW: 17.4 % — ABNORMAL HIGH (ref 11.5–15.5)
WBC: 15.5 K/uL — ABNORMAL HIGH (ref 4.0–10.5)
nRBC: 0.3 % — ABNORMAL HIGH (ref 0.0–0.2)

## 2021-11-23 LAB — COMPREHENSIVE METABOLIC PANEL WITH GFR
ALT: 15 U/L (ref 0–44)
AST: 22 U/L (ref 15–41)
Albumin: 2.7 g/dL — ABNORMAL LOW (ref 3.5–5.0)
Alkaline Phosphatase: 201 U/L — ABNORMAL HIGH (ref 38–126)
Anion gap: 14 (ref 5–15)
BUN: <5 mg/dL — ABNORMAL LOW (ref 6–20)
CO2: 18 mmol/L — ABNORMAL LOW (ref 22–32)
Calcium: 8.4 mg/dL — ABNORMAL LOW (ref 8.9–10.3)
Chloride: 104 mmol/L (ref 98–111)
Creatinine, Ser: 0.52 mg/dL (ref 0.44–1.00)
GFR, Estimated: >60 mL/min
Glucose, Bld: 110 mg/dL — ABNORMAL HIGH (ref 70–99)
Potassium: 3.7 mmol/L (ref 3.5–5.1)
Sodium: 136 mmol/L (ref 135–145)
Total Bilirubin: 0.7 mg/dL (ref 0.3–1.2)
Total Protein: 6.4 g/dL — ABNORMAL LOW (ref 6.5–8.1)

## 2021-11-23 LAB — URINALYSIS, ROUTINE W REFLEX MICROSCOPIC
Bilirubin Urine: NEGATIVE
Glucose, UA: NEGATIVE mg/dL
Hgb urine dipstick: NEGATIVE
Ketones, ur: 80 mg/dL — AB
Leukocytes,Ua: NEGATIVE
Nitrite: NEGATIVE
Protein, ur: 100 mg/dL — AB
Specific Gravity, Urine: 1.023 (ref 1.005–1.030)
pH: 6 (ref 5.0–8.0)

## 2021-11-23 LAB — LIPASE, BLOOD: Lipase: 24 U/L (ref 11–51)

## 2021-11-23 LAB — AMYLASE: Amylase: 49 U/L (ref 28–100)

## 2021-11-23 MED ORDER — ACETAMINOPHEN 325 MG PO TABS
650.0000 mg | ORAL_TABLET | Freq: Once | ORAL | Status: AC
  Administered 2021-11-23: 650 mg via ORAL
  Filled 2021-11-23: qty 2

## 2021-11-23 MED ORDER — LACTATED RINGERS IV BOLUS
1000.0000 mL | Freq: Once | INTRAVENOUS | Status: AC
  Administered 2021-11-23: 1000 mL via INTRAVENOUS

## 2021-11-23 MED ORDER — OXYCODONE HCL 5 MG PO TABS: 5.0000 mg | ORAL_TABLET | Freq: Four times a day (QID) | ORAL | 0 refills | Status: DC | PRN

## 2021-11-23 MED ORDER — OXYCODONE-ACETAMINOPHEN 5-325 MG PO TABS
1.0000 | ORAL_TABLET | Freq: Once | ORAL | Status: AC
  Administered 2021-11-23: 1 via ORAL
  Filled 2021-11-23: qty 1

## 2021-11-23 MED ORDER — DICYCLOMINE HCL 20 MG PO TABS
20.0000 mg | ORAL_TABLET | Freq: Once | ORAL | Status: AC
  Administered 2021-11-23: 20 mg via ORAL
  Filled 2021-11-23: qty 1

## 2021-11-23 MED ORDER — ACETAMINOPHEN 325 MG PO TABS: 650.0000 mg | ORAL_TABLET | ORAL | 0 refills | Status: AC

## 2021-11-23 MED ORDER — FENTANYL CITRATE (PF) 100 MCG/2ML IJ SOLN
100.0000 ug | Freq: Once | INTRAMUSCULAR | Status: AC
  Administered 2021-11-23: 100 ug via INTRAVENOUS
  Filled 2021-11-23: qty 2

## 2021-11-23 MED ORDER — PROMETHAZINE HCL 12.5 MG PO TABS: 12.5000 mg | ORAL_TABLET | Freq: Three times a day (TID) | ORAL | 1 refills | Status: DC | PRN

## 2021-11-23 MED ORDER — TAMSULOSIN HCL 0.4 MG PO CAPS
0.4000 mg | ORAL_CAPSULE | Freq: Every day | ORAL | Status: DC
Start: 2021-11-23 — End: 2021-11-24
  Administered 2021-11-23: 0.4 mg via ORAL
  Filled 2021-11-23: qty 1

## 2021-11-23 MED ORDER — TAMSULOSIN HCL 0.4 MG PO CAPS: 0.4000 mg | ORAL_CAPSULE | Freq: Every day | ORAL | 0 refills | Status: DC

## 2021-11-23 MED ORDER — PROMETHAZINE HCL 25 MG PO TABS
12.5000 mg | ORAL_TABLET | Freq: Four times a day (QID) | ORAL | Status: DC | PRN
  Administered 2021-11-23: 12.5 mg via ORAL
  Filled 2021-11-23: qty 1

## 2021-11-23 NOTE — MAU Note (Signed)
Maria Mercado is a 31 y.o. at [redacted]w[redacted]d here in MAU reporting: emesis and left sided pain since about 1330. The pain wraps around to her back. Reports 3-4 episodes of vomiting. Took 4 mg ODT zofran with no relief. No labor complaints, +FM. ? ?Onset of complaint: today ? ?Pain score: 7/10 ? ?Vitals:  ? 11/23/21 1842  ?BP: 114/77  ?Pulse: (!) 124  ?Resp: 20  ?Temp: 98.2 ?F (36.8 ?C)  ?SpO2: 98%  ?   ?FHT: +FM, pt wearing a dress ? ?Lab orders placed from triage: UA ? ?

## 2021-11-23 NOTE — MAU Provider Note (Addendum)
?History  ?  ? ?CSN: [id number] ? ?Arrival date and time: 11/23/21 1823 ? ? None  ?  ? ?Chief Complaint  ?Patient presents with  ? Abdominal Pain  ? Emesis  ? ?HPI ?Maria Mercado is a 31 y.o. [id number] at [redacted]w[redacted]d by 6 week ultrasound who presents to MAU for LUQ pain. Patient reports sudden onset of LUQ pain that radiates into upper back that started approximately around 1:30 pm today. She describes the pain as a constant, sharp, stabbing sensation that has "flairs of more pain". She reports she has vomited 3-4 times since the pain started, but does not currently have any nausea. She has not had anything to eat since 0900. She had berries and yogurt at that time. She denies fever/chills, constipation or diarrhea. Denies urinary s/s. She denies contractions, leaking fluid or vaginal bleeding. Endorses active fetal movement.  ? ?Receives prenatal care at [facility].  ? ?OB History   ? ? Gravida  ?4  ? Para  ?2  ? Term  ?2  ? Preterm  ?   ? AB  ?1  ? Living  ?2  ?  ? ? SAB  ?1  ? IAB  ?   ? Ectopic  ?   ? Multiple  ?0  ? Live Births  ?2  ?   ?  ?  ? ? ?Past Medical History:  ?Diagnosis Date  ? Anxiety   ? Back pain   ? Depression   ? History of kidney stones   ? HS (hereditary spherocytosis) (HCC)   ? ? ?Past Surgical History:  ?Procedure Laterality Date  ? CYST EXCISION    ? breast and axial   ? WISDOM TOOTH EXTRACTION    ? ? ?Family History  ?Problem Relation Age of Onset  ? Hypertension Mother   ? Hypertension Father   ? Stroke Maternal Grandfather   ? Heart attack Maternal Grandfather   ? ? ?Social History  ? ?Tobacco Use  ? Smoking status: Never  ? Smokeless tobacco: Never  ?Vaping Use  ? Vaping Use: Never used  ?Substance Use Topics  ? Alcohol use: Not Currently  ?  Comment: occ  ? Drug use: Never  ? ? ?Allergies:  ?Allergies  ?Allergen Reactions  ? Keflex [Cephalexin] Itching  ?  Has taken Keflex but was okay, except for one generic brand that made her itch.   ? ? ?Medications Prior to Admission  ?Medication  Sig Dispense Refill Last Dose  ? acetaminophen (TYLENOL) 325 MG tablet Take 2 tablets (650 mg total) by mouth every 4 (four) hours as needed (for pain scale < 4). 30 tablet 1   ? albuterol (VENTOLIN HFA) 108 (90 Base) MCG/ACT inhaler Inhale 2 puffs into the lungs every 4 (four) hours as needed for wheezing or shortness of breath. 1 each 0   ? benzonatate (TESSALON) 100 MG capsule Take 2 capsules (200 mg total) by mouth 3 (three) times daily as needed for cough. 21 capsule 0   ? Doxylamine-Pyridoxine 10-10 MG TBEC Take 1-2 tablets by mouth at bedtime. Start by taking two tablets at bedtime on day 1 and 2; if symptoms persist, take 1 tablet in morning and 2 tablets at bedtime on day 3; if symptoms persist, may increase to 1 tablet in morning, 1 tablet mid-afternoon, and 2 tablets at bedtime on day 4 (maximum: doxylamine 40 mg/pyridoxine 40 mg (4 tablets) per day). 60 tablet 2   ? FLUoxetine (PROZAC) 20 MG tablet Take 20  mg by mouth daily.      ? guaiFENesin (MUCINEX) 600 MG 12 hr tablet Take 2 tablets (1,200 mg total) by mouth 2 (two) times daily. 12 tablet 0   ? metoCLOPramide (REGLAN) 10 MG tablet Take 1 tablet (10 mg total) by mouth 4 (four) times daily. 60 tablet 2   ? ondansetron (ZOFRAN ODT) 8 MG disintegrating tablet Take 1 tablet (8 mg total) by mouth every 8 (eight) hours as needed for nausea or vomiting. 90 tablet 0   ? promethazine (PHENERGAN) 12.5 MG tablet Take 1 tablet (12.5 mg total) by mouth every 6 (six) hours as needed for nausea or vomiting. 30 tablet 1   ? scopolamine (TRANSDERM-SCOP) 1 MG/3DAYS Place 1 patch (1.5 mg total) onto the skin every 3 (three) days. 10 patch 12   ? Vitamin D, Ergocalciferol, (DRISDOL) 1.25 MG (50000 UNIT) CAPS capsule Take 50,000 Units by mouth once a week.     ? ? ?Review of Systems  ?Constitutional: Negative.   ?Respiratory: Negative.    ?Cardiovascular: Negative.   ?Gastrointestinal:  Positive for abdominal pain and vomiting. Negative for constipation, diarrhea and  nausea.  ?Genitourinary: Negative.   ?Musculoskeletal:  Positive for back pain.  ?Neurological: Negative.   ?Physical Exam  ? ?Blood pressure 133/84, pulse (!) 116, temperature 98.2 ?F (36.8 ?C), temperature source Oral, resp. rate 20, height 5\' 5"  (1.651 m), weight 86.2 kg, last menstrual period 03/08/2021, SpO2 98 %, unknown if currently breastfeeding. ? ?Physical Exam ?Vitals and nursing note reviewed.  ?Constitutional:   ?   General: She is not in acute distress. ?Cardiovascular:  ?   Rate and Rhythm: Tachycardia present.  ?Pulmonary:  ?   Effort: Pulmonary effort is normal.  ?Abdominal:  ?   Palpations: Abdomen is soft.  ?   Tenderness: There is abdominal tenderness in the left upper quadrant.  ?   Comments: Gravid ?  ?Genitourinary: ?   Comments: VE: 0.5/Thick/Posterior ?Skin: ?   General: Skin is warm and dry.  ?Neurological:  ?   General: No focal deficit present.  ?   Mental Status: She is alert and oriented to person, place, and time.  ?Psychiatric:     ?   Mood and Affect: Mood is anxious.     ?   Behavior: Behavior normal.  ? ?NST ?FHR: 140 bpm, moderate variability, +15x15 accels, no decels ?Toco: occasional ctx ? ?Cervix: 0.5cm dilated. Patient having intermittent contractions on monitor but not feeling them. Cervix unchanged from check in office. ? ?MAU Course  ?Procedures ?NST - reactive ?US ? ?MDM ?UA 80 ketones, negative for blood, leukocytes, or nitrites ?LR bolus with IV fentanyl given for pain. Offered antiemetics, patient declined ?CBC, CMP, amylase, lipase drawn ?Abdominal ultrasound pending ?Cervix 0.5/thick/posterior ?Patient reports fentanyl helped improve pain, but feels like pain may be trying to return ?Report given to Dr. Ephriam Jenkinsas at 2100 ? ?Brand Malesanielle L Simpson, CNM ?11/23/21 ?9:05 PM ? ? ?9:51 PM ?Abdominal US with left kidney with possible small stone. Discussed with radiologist on phone and suspect either artifact or small stone (2-5 mm). Patient previously with kidney stones in 2019.  Assessed patient at bedside and she reports pain in 2019 was similar to current pain. Given prior hx of kidney stones, current symptoms, as well as ruled out pancreatitis and normal gallbladder will treat as presumed kidney stone. Will give Flomax, pain medications, as well as phenergan.  ? ? ?Assessment and Plan  ?Suspected nephrolithiasis ?- Flomax sent to pharmacy ?-  Phenergan for renal colic during passage of stone ?- Oxycodone and tylenol for pain management ?- discussed getting urine strainer to assess for passing of stone ?- also discussed return precautions in detail including signs of fever or other systemic signs of infection, decreased urine output, worsening pain that is not better with medications, intractable nausea and vomiting.  ? ?Warner Mccreedy, MD, MPH ?OB Fellow, Faculty Practice ?

## 2021-11-23 NOTE — Discharge Instructions (Signed)
You came to the MAU because you had sharp left sided pain. We did labs that looked normal and an ultrasound that showed you likely have a very small kidney stone. Your vital signs were normal and your pain improved with the pain medications so we discharged you home with flomax to help pass the stone, phenergan to help with cramping that can happen with stone passing, and tylenol and oxycodone for pain management.  ? ?Please seek medical care if you develop fever, severe nausea and vomiting that doesn't get better with medications, or severe pain that is worsening even with the medications. ? ? ?

## 2021-11-24 ENCOUNTER — Encounter (HOSPITAL_COMMUNITY): Payer: Self-pay | Admitting: Obstetrics and Gynecology

## 2021-11-24 ENCOUNTER — Other Ambulatory Visit: Payer: Self-pay

## 2021-11-24 ENCOUNTER — Inpatient Hospital Stay (HOSPITAL_COMMUNITY)
Admission: AD | Admit: 2021-11-24 | Discharge: 2021-11-24 | Disposition: A | Discharge status: Home / Self Care | Attending: Obstetrics and Gynecology | Admitting: Obstetrics and Gynecology

## 2021-11-24 DIAGNOSIS — Z87442 Personal history of urinary calculi: Secondary | ICD-10-CM | POA: Insufficient documentation

## 2021-11-24 DIAGNOSIS — O26893 Other specified pregnancy related conditions, third trimester: Secondary | ICD-10-CM | POA: Insufficient documentation

## 2021-11-24 DIAGNOSIS — Z3A37 37 weeks gestation of pregnancy: Secondary | ICD-10-CM | POA: Diagnosis not present

## 2021-11-24 DIAGNOSIS — N23 Unspecified renal colic: Secondary | ICD-10-CM

## 2021-11-24 DIAGNOSIS — R109 Unspecified abdominal pain: Secondary | ICD-10-CM | POA: Diagnosis not present

## 2021-11-24 LAB — URINALYSIS, ROUTINE W REFLEX MICROSCOPIC
Bilirubin Urine: NEGATIVE
Glucose, UA: NEGATIVE mg/dL
Hgb urine dipstick: NEGATIVE
Ketones, ur: NEGATIVE mg/dL
Leukocytes,Ua: NEGATIVE
Nitrite: NEGATIVE
Protein, ur: NEGATIVE mg/dL
Specific Gravity, Urine: 1.012 (ref 1.005–1.030)
pH: 7 (ref 5.0–8.0)

## 2021-11-24 MED ORDER — LACTATED RINGERS IV BOLUS
1000.0000 mL | Freq: Once | INTRAVENOUS | Status: AC
  Administered 2021-11-24: 1000 mL via INTRAVENOUS

## 2021-11-24 MED ORDER — PROCHLORPERAZINE EDISYLATE 10 MG/2ML IJ SOLN
10.0000 mg | Freq: Once | INTRAMUSCULAR | Status: AC
  Administered 2021-11-24: 10 mg via INTRAVENOUS
  Filled 2021-11-24: qty 2

## 2021-11-24 MED ORDER — POLYETHYLENE GLYCOL 3350 17 G PO PACK: 17.0000 g | PACK | Freq: Every day | ORAL | 0 refills | Status: AC

## 2021-11-24 MED ORDER — OXYCODONE HCL 5 MG PO TABS: 5.0000 mg | ORAL_TABLET | ORAL | 0 refills | Status: DC | PRN

## 2021-11-24 MED ORDER — POLYETHYLENE GLYCOL 3350 17 G PO PACK
17.0000 g | PACK | Freq: Every day | ORAL | Status: DC
  Administered 2021-11-24: 17 g via ORAL
  Filled 2021-11-24: qty 1

## 2021-11-24 MED ORDER — HYDROMORPHONE HCL 1 MG/ML IJ SOLN
1.0000 mg | INTRAMUSCULAR | Status: DC | PRN
  Administered 2021-11-24: 1 mg via INTRAVENOUS
  Filled 2021-11-24: qty 1

## 2021-11-24 NOTE — MAU Note (Signed)
Maria Mercado is a 31 y.o. at [redacted]w[redacted]d here in MAU reporting: ongoing pain from visit yesterday. States pain medications make her fall asleep for a couple of hours and then when she wakes up the pain is back. No contractions, bleeding, or LOF. +FM ? ?Onset of complaint: ongoing ? ?Pain score: 6/10 ? ?Vitals:  ? 11/24/21 1745  ?BP: 132/79  ?Pulse: 94  ?Resp: 18  ?Temp: 98.1 ?F (36.7 ?C)  ?SpO2: 100%  ?   ?FHT:+FM ? ?Lab orders placed from triage: UA ? ?

## 2021-11-24 NOTE — MAU Provider Note (Addendum)
?History  ?  ? ?CSN: [id number] ? ?Arrival date and time: 11/24/21 1732 ? ? Event Date/Time  ? First Provider Initiated Contact with Patient 11/24/21 1814   ?  ? ?Chief Complaint  ?Patient presents with  ? Flank Pain  ? ?31 year old gravida 4 para 2-0-1-2 at 37.2 weeks presenting with left flank pain.  She was MAU yesterday presents and diagnosed with a possible nonobstructing stone.  Describes pain as constant stabbing, rates 6 out of 10.  She has been using oxycodone every 6 hours but think it wears off about an hour or more before its due.  She started Flomax today. enies urinary symptoms.  Reports good fetal movement.  No pregnancy complaints.  She has a history of kidney stones in her last pregnancy, was followed by urology. ? ? ? ?OB History   ? ? Gravida  ?4  ? Para  ?2  ? Term  ?2  ? Preterm  ?   ? AB  ?1  ? Living  ?2  ?  ? ? SAB  ?1  ? IAB  ?   ? Ectopic  ?   ? Multiple  ?0  ? Live Births  ?2  ?   ?  ?  ? ? ?Past Medical History:  ?Diagnosis Date  ? Anxiety   ? Back pain   ? Depression   ? History of kidney stones   ? HS (hereditary spherocytosis) (HCC)   ? ? ?Past Surgical History:  ?Procedure Laterality Date  ? CYST EXCISION    ? breast and axial   ? WISDOM TOOTH EXTRACTION    ? ? ?Family History  ?Problem Relation Age of Onset  ? Hypertension Mother   ? Hypertension Father   ? Stroke Maternal Grandfather   ? Heart attack Maternal Grandfather   ? ? ?Social History  ? ?Tobacco Use  ? Smoking status: Never  ? Smokeless tobacco: Never  ?Vaping Use  ? Vaping Use: Never used  ?Substance Use Topics  ? Alcohol use: Not Currently  ?  Comment: occ  ? Drug use: Never  ? ? ?Allergies:  ?Allergies  ?Allergen Reactions  ? Keflex [Cephalexin] Itching  ?  Has taken Keflex but was okay, except for one generic brand that made her itch.   ? ? ?Medications Prior to Admission  ?Medication Sig Dispense Refill Last Dose  ? acetaminophen (TYLENOL) 325 MG tablet Take 2 tablets (650 mg total) by mouth every 4 (four) hours for 10  days. 60 tablet 0   ? albuterol (VENTOLIN HFA) 108 (90 Base) MCG/ACT inhaler Inhale 2 puffs into the lungs every 4 (four) hours as needed for wheezing or shortness of breath. 1 each 0   ? benzonatate (TESSALON) 100 MG capsule Take 2 capsules (200 mg total) by mouth 3 (three) times daily as needed for cough. 21 capsule 0   ? FLUoxetine (PROZAC) 20 MG tablet Take 20 mg by mouth daily.      ? guaiFENesin (MUCINEX) 600 MG 12 hr tablet Take 2 tablets (1,200 mg total) by mouth 2 (two) times daily. 12 tablet 0   ? oxyCODONE (ROXICODONE) 5 MG immediate release tablet Take 1 tablet (5 mg total) by mouth every 6 (six) hours as needed for severe pain. 15 tablet 0   ? promethazine (PHENERGAN) 12.5 MG tablet Take 1 tablet (12.5 mg total) by mouth every 8 (eight) hours as needed (cramping with stone passing). 10 tablet 1   ? tamsulosin (FLOMAX) 0.4 MG  CAPS capsule Take 1 capsule (0.4 mg total) by mouth daily. Take once a day for 14 days or until stone passes 14 capsule 0   ? Vitamin D, Ergocalciferol, (DRISDOL) 1.25 MG (50000 UNIT) CAPS capsule Take 50,000 Units by mouth once a week.     ? ? ?Review of Systems  ?Constitutional:  Negative for chills and fever.  ?Gastrointestinal:  Negative for abdominal pain.  ?Genitourinary:  Positive for flank pain. Negative for dysuria, hematuria, vaginal bleeding and vaginal discharge.  ?Physical Exam  ? ?Blood pressure 132/79, pulse 94, temperature 98.1 ?F (36.7 ?C), temperature source Oral, resp. rate 18, last menstrual period 03/08/2021, SpO2 100 %, unknown if currently breastfeeding. ? ?Physical Exam ?Vitals and nursing note reviewed.  ?Constitutional:   ?   General: Distressed: tearful.  ?   Appearance: Normal appearance.  ?Cardiovascular:  ?   Rate and Rhythm: Normal rate.  ?Pulmonary:  ?   Effort: Pulmonary effort is normal. No respiratory distress.  ?Abdominal:  ?   Palpations: Abdomen is soft.  ?   Tenderness: There is no abdominal tenderness. There is no right CVA tenderness or left  CVA tenderness.  ? ? ?Musculoskeletal:     ?   General: Normal range of motion.  ?   Cervical back: Normal range of motion.  ?Skin: ?   General: Skin is warm and dry.  ?Neurological:  ?   General: No focal deficit present.  ?   Mental Status: She is alert and oriented to person, place, and time.  ?Psychiatric:     ?   Mood and Affect: Mood normal.     ?   Behavior: Behavior normal.  ?EFM: 150 bpm, mod variability, + accels, no decels ?Toco: none ? ?Results for orders placed or performed during the hospital encounter of 11/24/21 (from the past 24 hour(s))  ?Urinalysis, Routine w reflex microscopic Urine, Clean Catch     Status: Abnormal  ? Collection Time: 11/24/21  5:37 PM  ?Result Value Ref Range  ? Color, Urine YELLOW YELLOW  ? APPearance HAZY (A) CLEAR  ? Specific Gravity, Urine 1.012 1.005 - 1.030  ? pH 7.0 5.0 - 8.0  ? Glucose, UA NEGATIVE NEGATIVE mg/dL  ? Hgb urine dipstick NEGATIVE NEGATIVE  ? Bilirubin Urine NEGATIVE NEGATIVE  ? Ketones, ur NEGATIVE NEGATIVE mg/dL  ? Protein, ur NEGATIVE NEGATIVE mg/dL  ? Nitrite NEGATIVE NEGATIVE  ? Leukocytes,Ua NEGATIVE NEGATIVE  ? ?US Abdomen Complete ? ?Result Date: 11/23/2021 ?CLINICAL DATA:  31 year old female with abdominal pain. Patient is [redacted] weeks pregnant. EXAM: ABDOMEN ULTRASOUND COMPLETE COMPARISON:  2019 ultrasound and CT FINDINGS: Gallbladder: The gallbladder is unremarkable. There is no evidence of cholelithiasis or acute cholecystitis. Common bile duct: Diameter: 1.6 mm. There is no evidence of intrahepatic or extrahepatic biliary dilatation. Liver: No focal lesion identified. Within normal limits in parenchymal echogenicity. Portal vein is patent on color Doppler imaging with normal direction of blood flow towards the liver. IVC: Not well visualized Pancreas: Visualized portion unremarkable. Spleen: Size and appearance within normal limits. Right Kidney: Length: 12.4 cm. Echogenicity within normal limits. No mass or hydronephrosis visualized. Left Kidney:  Length: 13.6 cm. Possible tiny nonobstructing calculi noted with the LEFT kidney. Echogenicity within normal limits. No mass or hydronephrosis visualized. Abdominal aorta: No aneurysm visualized. Other findings: None. IMPRESSION: 1. Possible tiny nonobstructing LEFT renal calculi. 2. Otherwise unremarkable abdominal ultrasound. No evidence of acute abnormality or hydronephrosis. Electronically Signed   By: Henrietta Hoover.D.  On: 11/23/2021 20:55   ? ?MAU Course  ?Procedures ?LR ?Compazine ?Dilaudid ? ?MDM ?Prenatal records reviewed, pregnancy complicated by history of kidney stones, anxiety, PCOS, HEG.  Labs ordered and reviewed. ?Transfer of care given to Rim[place] Colony[place]ooist[place], CNM ?Donette LarryBhambri, Melanie, CNM  ?11/24/2021 8:04 PM  ? ?Assessment and Plan  ? ?1. Renal pain   ?-patient UA and US are reassuring; no signs of obstructing stone and no blood in her urine ?-patient report 0/10 pain right now, has been sleeping.  ?-patient to keep OB appt at 1:45pm tomorrow afternoon ?-discussed that she should call her urologist tomorrow as well to see about being seen ?-increase oxy to q 4 hours; also given RX for miralax ?-all questions answered, patient stable for discharge.  ?

## 2021-11-26 ENCOUNTER — Encounter (HOSPITAL_COMMUNITY): Payer: Self-pay | Admitting: Obstetrics and Gynecology

## 2021-11-26 ENCOUNTER — Inpatient Hospital Stay (HOSPITAL_COMMUNITY): Payer: No Typology Code available for payment source

## 2021-11-26 ENCOUNTER — Inpatient Hospital Stay (HOSPITAL_COMMUNITY)
Admission: AD | Admit: 2021-11-26 | Discharge: 2021-11-28 | DRG: 832 | Disposition: A | Discharge status: Home / Self Care | Payer: No Typology Code available for payment source | Attending: Obstetrics and Gynecology | Admitting: Obstetrics and Gynecology

## 2021-11-26 ENCOUNTER — Other Ambulatory Visit: Payer: Self-pay

## 2021-11-26 DIAGNOSIS — R109 Unspecified abdominal pain: Principal | ICD-10-CM | POA: Diagnosis present

## 2021-11-26 DIAGNOSIS — O212 Late vomiting of pregnancy: Secondary | ICD-10-CM | POA: Diagnosis not present

## 2021-11-26 DIAGNOSIS — Z3A37 37 weeks gestation of pregnancy: Secondary | ICD-10-CM

## 2021-11-26 DIAGNOSIS — O26899 Other specified pregnancy related conditions, unspecified trimester: Principal | ICD-10-CM | POA: Diagnosis present

## 2021-11-26 DIAGNOSIS — O26833 Pregnancy related renal disease, third trimester: Secondary | ICD-10-CM | POA: Diagnosis not present

## 2021-11-26 DIAGNOSIS — N202 Calculus of kidney with calculus of ureter: Secondary | ICD-10-CM | POA: Diagnosis present

## 2021-11-26 LAB — CBC
HCT: 29.6 % — ABNORMAL LOW (ref 36.0–46.0)
Hemoglobin: 8.4 g/dL — ABNORMAL LOW (ref 12.0–15.0)
MCH: 20.7 pg — ABNORMAL LOW (ref 26.0–34.0)
MCHC: 28.4 g/dL — ABNORMAL LOW (ref 30.0–36.0)
MCV: 72.9 fL — ABNORMAL LOW (ref 80.0–100.0)
Platelets: 346 10*3/uL (ref 150–400)
RBC: 4.06 MIL/uL (ref 3.87–5.11)
RDW: 17.7 % — ABNORMAL HIGH (ref 11.5–15.5)
WBC: 11.4 10*3/uL — ABNORMAL HIGH (ref 4.0–10.5)
nRBC: 1 % — ABNORMAL HIGH (ref 0.0–0.2)

## 2021-11-26 LAB — COMPREHENSIVE METABOLIC PANEL
ALT: 13 U/L (ref 0–44)
AST: 19 U/L (ref 15–41)
Albumin: 2.6 g/dL — ABNORMAL LOW (ref 3.5–5.0)
Alkaline Phosphatase: 183 U/L — ABNORMAL HIGH (ref 38–126)
Anion gap: 11 (ref 5–15)
BUN: 5 mg/dL — ABNORMAL LOW (ref 6–20)
CO2: 21 mmol/L — ABNORMAL LOW (ref 22–32)
Calcium: 8.7 mg/dL — ABNORMAL LOW (ref 8.9–10.3)
Chloride: 102 mmol/L (ref 98–111)
Creatinine, Ser: 0.56 mg/dL (ref 0.44–1.00)
GFR, Estimated: >60 mL/min (ref >60)
Glucose, Bld: 86 mg/dL (ref 70–99)
Potassium: 3.3 mmol/L — ABNORMAL LOW (ref 3.5–5.1)
Sodium: 134 mmol/L — ABNORMAL LOW (ref 135–145)
Total Bilirubin: 1.2 mg/dL (ref 0.3–1.2)
Total Protein: 6.5 g/dL (ref 6.5–8.1)

## 2021-11-26 LAB — URINALYSIS, ROUTINE W REFLEX MICROSCOPIC
Bilirubin Urine: NEGATIVE
Glucose, UA: NEGATIVE mg/dL
Hgb urine dipstick: NEGATIVE
Ketones, ur: 80 mg/dL — AB
Leukocytes,Ua: NEGATIVE
Nitrite: NEGATIVE
Protein, ur: 100 mg/dL — AB
Specific Gravity, Urine: 1.032 — ABNORMAL HIGH (ref 1.005–1.030)
pH: 5 (ref 5.0–8.0)

## 2021-11-26 LAB — TYPE AND SCREEN
ABO/RH(D): O POS
Antibody Screen: NEGATIVE

## 2021-11-26 MED ORDER — DOCUSATE SODIUM 100 MG PO CAPS
100.0000 mg | ORAL_CAPSULE | Freq: Every day | ORAL | Status: DC
  Administered 2021-11-27: 100 mg via ORAL
  Filled 2021-11-26 (×2): qty 1

## 2021-11-26 MED ORDER — HYDROMORPHONE HCL 1 MG/ML IJ SOLN
1.0000 mg | INTRAMUSCULAR | Status: DC | PRN
Start: 2021-11-26 — End: 2021-11-26
  Administered 2021-11-26: 1 mg via INTRAVENOUS
  Filled 2021-11-26: qty 1

## 2021-11-26 MED ORDER — HYDROMORPHONE HCL 1 MG/ML IJ SOLN
1.0000 mg | INTRAMUSCULAR | Status: DC | PRN
Start: 2021-11-26 — End: 2021-11-28

## 2021-11-26 MED ORDER — ONDANSETRON HCL 4 MG/2ML IJ SOLN
4.0000 mg | Freq: Four times a day (QID) | INTRAMUSCULAR | Status: DC | PRN
  Administered 2021-11-27: 4 mg via INTRAVENOUS
  Filled 2021-11-26: qty 2

## 2021-11-26 MED ORDER — LACTATED RINGERS IV SOLN: INTRAVENOUS | Status: DC

## 2021-11-26 MED ORDER — ACETAMINOPHEN 325 MG PO TABS: 650.0000 mg | ORAL_TABLET | ORAL | Status: DC | PRN

## 2021-11-26 MED ORDER — PRENATAL MULTIVITAMIN CH
1.0000 | ORAL_TABLET | Freq: Every day | ORAL | Status: DC
  Administered 2021-11-27: 1 via ORAL
  Filled 2021-11-26: qty 1

## 2021-11-26 MED ORDER — OXYCODONE-ACETAMINOPHEN 5-325 MG PO TABS
1.0000 | ORAL_TABLET | ORAL | Status: DC | PRN
  Administered 2021-11-26: 2 via ORAL
  Administered 2021-11-27: 1 via ORAL
  Administered 2021-11-27: 2 via ORAL
  Administered 2021-11-27: 1 via ORAL
  Administered 2021-11-27: 2 via ORAL
  Administered 2021-11-28: 1 via ORAL
  Filled 2021-11-26: qty 1
  Filled 2021-11-26 (×2): qty 2
  Filled 2021-11-26: qty 1
  Filled 2021-11-26: qty 2
  Filled 2021-11-26: qty 1

## 2021-11-26 MED ORDER — LACTATED RINGERS IV SOLN: Freq: Once | INTRAVENOUS | Status: AC

## 2021-11-26 MED ORDER — FLUOXETINE HCL 20 MG PO CAPS
20.0000 mg | ORAL_CAPSULE | Freq: Every day | ORAL | Status: DC
  Administered 2021-11-27 – 2021-11-28 (×2): 20 mg via ORAL
  Filled 2021-11-26 (×2): qty 1

## 2021-11-26 MED ORDER — ZOLPIDEM TARTRATE 5 MG PO TABS: 5.0000 mg | ORAL_TABLET | Freq: Every evening | ORAL | Status: DC | PRN

## 2021-11-26 MED ORDER — CALCIUM CARBONATE ANTACID 500 MG PO CHEW: 2.0000 | CHEWABLE_TABLET | ORAL | Status: DC | PRN

## 2021-11-26 NOTE — MAU Provider Note (Signed)
?History  ?  ? ?CSN: [id number] ? ?Arrival date and time: 11/26/21 1453 ? ? Event Date/Time  ? First Provider Initiated Contact with Patient 11/26/21 1518   ?  ? ?Chief Complaint  ?Patient presents with  ? Back Pain  ? ?HPI ?Maria Mercado is a 31 y.o. [phone] at [redacted]w[redacted]d who presents to MAU for follow up surveillance to left kidney stone and persistent left flank pain. Patient was diagnosed with "possible tiny nonobstructing calcucli" in her left kidney during an MAU encounter 11/23/2021. She sought outpatient follow up at the [facility] but was told her symptoms were not consistent with a kidney stone. Patient reports left flank pain score 4/10 on arrival to MAU. She took 10 mg Roxicodone at 1100 hours. She denies vaginal bleeding, leaking of fluid, decreased fetal movement, fever, falls, or recent illness.  ? ?OB History   ? ? Gravida  ?4  ? Para  ?2  ? Term  ?2  ? Preterm  ?   ? AB  ?1  ? Living  ?2  ?  ? ? SAB  ?1  ? IAB  ?   ? Ectopic  ?   ? Multiple  ?0  ? Live Births  ?2  ?   ?  ?  ? ? ?Past Medical History:  ?Diagnosis Date  ? Anxiety   ? Back pain   ? Depression   ? History of kidney stones   ? HS (hereditary spherocytosis) (HCC)   ? ? ?Past Surgical History:  ?Procedure Laterality Date  ? CYST EXCISION    ? breast and axial   ? WISDOM TOOTH EXTRACTION    ? ? ?Family History  ?Problem Relation Age of Onset  ? Hypertension Mother   ? Hypertension Father   ? Stroke Maternal Grandfather   ? Heart attack Maternal Grandfather   ? ? ?Social History  ? ?Tobacco Use  ? Smoking status: Never  ? Smokeless tobacco: Never  ?Vaping Use  ? Vaping Use: Never used  ?Substance Use Topics  ? Alcohol use: Not Currently  ?  Comment: occ  ? Drug use: Never  ? ? ?Allergies:  ?Allergies  ?Allergen Reactions  ? Keflex [Cephalexin] Itching  ?  Has taken Keflex but was okay, except for one generic brand that made her itch.   ? ? ?Medications Prior to Admission  ?Medication Sig Dispense Refill Last Dose  ? acetaminophen (TYLENOL) 325 MG tablet  Take 2 tablets (650 mg total) by mouth every 4 (four) hours for 10 days. 60 tablet 0 11/26/2021 at 1100  ? oxyCODONE (ROXICODONE) 5 MG immediate release tablet Take 1 tablet (5 mg total) by mouth every 4 (four) hours as needed for severe pain. 15 tablet 0 11/26/2021 at 1100  ? promethazine (PHENERGAN) 12.5 MG tablet Take 1 tablet (12.5 mg total) by mouth every 8 (eight) hours as needed (cramping with stone passing). 10 tablet 1 11/26/2021 at 0830  ? tamsulosin (FLOMAX) 0.4 MG CAPS capsule Take 1 capsule (0.4 mg total) by mouth daily. Take once a day for 14 days or until stone passes 14 capsule 0 11/26/2021 at 0800  ? albuterol (VENTOLIN HFA) 108 (90 Base) MCG/ACT inhaler Inhale 2 puffs into the lungs every 4 (four) hours as needed for wheezing or shortness of breath. 1 each 0   ? FLUoxetine (PROZAC) 20 MG tablet Take 20 mg by mouth daily.      ? oxyCODONE (ROXICODONE) 5 MG immediate release tablet Take 1 tablet (5  mg total) by mouth every 4 (four) hours as needed for severe pain. 15 tablet 0   ? polyethylene glycol (MIRALAX / GLYCOLAX) 17 g packet Take 17 g by mouth daily. 14 each 0   ? Vitamin D, Ergocalciferol, (DRISDOL) 1.25 MG (50000 UNIT) CAPS capsule Take 50,000 Units by mouth once a week.     ? ? ?Review of Systems  ?Constitutional:  Positive for fatigue. Negative for fever.  ?Genitourinary:  Positive for flank pain.  ?All other systems reviewed and are negative. ?Physical Exam  ? ?Blood pressure 115/69, pulse (!) 115, temperature 98.4 ?F (36.9 ?C), temperature source Oral, resp. rate (!) 21, last menstrual period 03/08/2021, SpO2 99 %, unknown if currently breastfeeding. ? ?Physical Exam ?Vitals and nursing note reviewed. Exam conducted with a chaperone present.  ?Constitutional:   ?   Appearance: Normal appearance.  ?Cardiovascular:  ?   Rate and Rhythm: Normal rate.  ?   Pulses: Normal pulses.  ?Pulmonary:  ?   Effort: Pulmonary effort is normal.  ?Abdominal:  ?   Tenderness: There is left CVA tenderness.  There is no right CVA tenderness.  ?   Comments: Gravid  ?Skin: ?   Capillary Refill: Capillary refill takes less than 2 seconds.  ?Neurological:  ?   Mental Status: She is alert and oriented to person, place, and time.  ?Psychiatric:     ?   Mood and Affect: Mood normal.     ?   Behavior: Behavior normal.     ?   Thought Content: Thought content normal.     ?   Judgment: Judgment normal.  ? ? ?MAU Course  ?Procedures ? ?MDM ? ?--Reactive tracing prior to IV Dilaudid and after CT ?--Baseline 150, mod var, + accels, no decels ?--Toco: occasional contraction, uterine irritability ?--Report received from Dr. Senaida Ores prior to patient arrival. Plan for possible IOL if obstructive stone, alternatively may observe in [facility] for pain management and IV fluids. ? ?Patient Vitals for the past 24 hrs: ? BP Temp Temp src Pulse Resp SpO2  ?11/26/21 1543 115/69 -- -- (!) 115 -- --  ?11/26/21 1516 125/82 98.4 ?F (36.9 ?C) Oral (!) 123 (!) 21 99 %  ? ?Results for orders placed or performed during the hospital encounter of 11/26/21 (from the past 24 hour(s))  ?Urinalysis, Routine w reflex microscopic Urine, Clean Catch     Status: Abnormal  ? Collection Time: 11/26/21  3:05 PM  ?Result Value Ref Range  ? Color, Urine AMBER (A) YELLOW  ? APPearance CLOUDY (A) CLEAR  ? Specific Gravity, Urine 1.032 (H) 1.005 - 1.030  ? pH 5.0 5.0 - 8.0  ? Glucose, UA NEGATIVE NEGATIVE mg/dL  ? Hgb urine dipstick NEGATIVE NEGATIVE  ? Bilirubin Urine NEGATIVE NEGATIVE  ? Ketones, ur 80 (A) NEGATIVE mg/dL  ? Protein, ur 100 (A) NEGATIVE mg/dL  ? Nitrite NEGATIVE NEGATIVE  ? Leukocytes,Ua NEGATIVE NEGATIVE  ? RBC / HPF 0-5 0 - 5 RBC/hpf  ? WBC, UA 11-20 0 - 5 WBC/hpf  ? Bacteria, UA FEW (A) NONE SEEN  ? Squamous Epithelial / LPF 21-50 0 - 5  ? Mucus PRESENT   ? Hyaline Casts, UA PRESENT   ?CBC     Status: Abnormal  ? Collection Time: 11/26/21  3:30 PM  ?Result Value Ref Range  ? WBC 11.4 (H) 4.0 - 10.5 K/uL  ? RBC 4.06 3.87 - 5.11 MIL/uL  ? Hemoglobin 8.4  (L) 12.0 - 15.0 g/dL  ?  HCT 29.6 (L) 36.0 - 46.0 %  ? MCV 72.9 (L) 80.0 - 100.0 fL  ? MCH 20.7 (L) 26.0 - 34.0 pg  ? MCHC 28.4 (L) 30.0 - 36.0 g/dL  ? RDW 17.7 (H) 11.5 - 15.5 %  ? Platelets 346 150 - 400 K/uL  ? nRBC 1.0 (H) 0.0 - 0.2 %  ?Comprehensive metabolic panel     Status: Abnormal  ? Collection Time: 11/26/21  3:30 PM  ?Result Value Ref Range  ? Sodium 134 (L) 135 - 145 mmol/L  ? Potassium 3.3 (L) 3.5 - 5.1 mmol/L  ? Chloride 102 98 - 111 mmol/L  ? CO2 21 (L) 22 - 32 mmol/L  ? Glucose, Bld 86 70 - 99 mg/dL  ? BUN 5 (L) 6 - 20 mg/dL  ? Creatinine, Ser 0.56 0.44 - 1.00 mg/dL  ? Calcium 8.7 (L) 8.9 - 10.3 mg/dL  ? Total Protein 6.5 6.5 - 8.1 g/dL  ? Albumin 2.6 (L) 3.5 - 5.0 g/dL  ? AST 19 15 - 41 U/L  ? ALT 13 0 - 44 U/L  ? Alkaline Phosphatase 183 (H) 38 - 126 U/L  ? Total Bilirubin 1.2 0.3 - 1.2 mg/dL  ? GFR, Estimated >60 >60 mL/min  ? Anion gap 11 5 - 15  ? ?US RENAL ? ?Result Date: 11/26/2021 ?CLINICAL DATA:  Left upper quadrant/flank pain, kidney stone seen on prior ultrasound EXAM: RENAL / URINARY TRACT ULTRASOUND COMPLETE COMPARISON:  Renal ultrasound 11/23/2021 FINDINGS: Right Kidney: Renal measurements: 11.6 cm x 6.4 cm x 6.5 cm = volume: 53 mL. Echogenicity within normal limits. No mass or hydronephrosis visualized. No shadowing stone is seen. Left Kidney: Renal measurements: 14.3 cm x 5.8 cm x 4.9 = volume: 114 mL. Echogenicity within normal limits. No mass or hydronephrosis visualized. No shadowing stone is seen. Bladder: The bladder is not identified and not well assessed due to the gravid uterus. Other: None. IMPRESSION: Normal renal ultrasound. The previously described possible stone on the left is not seen. No hydronephrosis. Electronically Signed   By: Lesia HausenPeter  Noone M.D.   On: 11/26/2021 16:44  ? ?CT Renal Stone Study ? ?Result Date: 11/26/2021 ?CLINICAL DATA:  Flank pain, renal stone suspected. EXAM: CT ABDOMEN AND PELVIS WITHOUT CONTRAST TECHNIQUE: Multidetector CT imaging of the abdomen and  pelvis was performed following the standard protocol without IV contrast. RADIATION DOSE REDUCTION: This exam was performed according to the departmental dose-optimization program which includes automat

## 2021-11-26 NOTE — H&P (Signed)
Maria Mercado is a 31 y.o. female presenting for nausea/vomiting/flank pain ? ?31 yo [id number] @ 37+4 presents for evaluation of persistent flank pain, nausea and vomiting.  The patient was originally evaluated on 11/23/2021 for similar complaints and was diagnosed with a possible nonobstructing kidney stone on the left side.  She was discharged home with pain medication.  She returned on 3/19 with similar complaints and was again discharged home on Flomax and pain medication.  Patient was then evaluated by urology today [facility] which showed similar findings to the 2 prior renal ultrasounds.  She was sent to maternity admissions for additional evaluation for nausea, vomiting with inability to keep fluids down and continued need for pain management. ?OB History   ? ? Gravida  ?4  ? Para  ?2  ? Term  ?2  ? Preterm  ?   ? AB  ?1  ? Living  ?2  ?  ? ? SAB  ?1  ? IAB  ?   ? Ectopic  ?   ? Multiple  ?0  ? Live Births  ?2  ?   ?  ?  ? ?Past Medical History:  ?Diagnosis Date  ? Anxiety   ? Back pain   ? Depression   ? History of kidney stones   ? HS (hereditary spherocytosis) (HCC)   ? ?Past Surgical History:  ?Procedure Laterality Date  ? CYST EXCISION    ? breast and axial   ? WISDOM TOOTH EXTRACTION    ? ?Family History: family history includes Heart attack in her maternal grandfather; Hypertension in her father and mother; Stroke in her maternal grandfather. ?Social History:  reports that she has never smoked. She has never used smokeless tobacco. She reports that she does not currently use alcohol. She reports that she does not use drugs. ? ? ?  ?Maternal Diabetes: No ?Genetic Screening: Normal ?Maternal Ultrasounds/Referrals: Normal ?Fetal Ultrasounds or other Referrals:  None ?Maternal Substance Abuse:  No ?Significant Maternal Medications:  None ?Significant Maternal Lab Results:  Other: RNI ?Other Comments:  None ? ?Review of Systems ?History ?  ?Blood pressure 115/69, pulse (!) 115, temperature 98.4 ?F (36.9  ?C), temperature source Oral, resp. rate (!) 21, last menstrual period 03/08/2021, SpO2 99 %, unknown if currently breastfeeding. ?Exam ?Physical Exam  ?Prenatal labs: ?ABO, Rh:  O pos ?Antibody:  Neg ?Rubella:  NI ?RPR:   NR ?HBsAg:   Neg ?HIV:   NR ?GBS:   Neg ? ?CT Renal Stone Study 11/26/2021 ? ?Narrative ?CLINICAL DATA:  Flank pain, renal stone suspected. ? ?EXAM: ?CT ABDOMEN AND PELVIS WITHOUT CONTRAST ? ?TECHNIQUE: ?Multidetector CT imaging of the abdomen and pelvis was performed ?following the standard protocol without IV contrast. ? ?RADIATION DOSE REDUCTION: This exam was performed according to the ?departmental dose-optimization program which includes automated ?exposure control, adjustment of the mA and/or kV according to ?patient size and/or use of iterative reconstruction technique. ? ?COMPARISON:  CT May 21, 2018 ? ?FINDINGS: ?Lower chest: No acute abnormality. ? ?Hepatobiliary: No suspicious hepatic lesion on noncontrast ?examination. Gallbladder is distended without wall thickening or ?pericholecystic fluid identified. No biliary ductal dilation. ? ?Pancreas: No pancreatic ductal dilation or evidence of acute ?inflammation. ? ?Spleen: No splenomegaly or focal splenic lesion. ? ?Adrenals/Urinary Tract: Mild thickening of the left adrenal gland ?without focal nodularity is favored hyperplasia. Right adrenal gland ?appears normal. No hydronephrosis. Punctate nonobstructive right ?lower pole renal stone. No obstructive ureteral or bladder calculi ?identified. There are few punctate calcifications  in the left ?hemipelvis along the course of the left ureter, none of which ?definitely demonstrate a rim of soft tissue or can be confidently ?located within the left ureter. ? ?Stomach/Bowel: No enteric contrast was administered. Stomach is ?unremarkable for degree of distension. No pathologic dilation of ?small or large bowel. No evidence of acute bowel inflammation. ? ?Vascular/Lymphatic: Normal  caliber abdominal aorta. No ?pathologically enlarged abdominal or pelvic lymph nodes. ? ?Reproductive: Gravid uterus with baby in cephalad position, not well ?evaluated on this study. ? ?Other: Trace pelvic free fluid. ? ?Musculoskeletal: No acute osseous finding. ? ?IMPRESSION: ?1. Punctate nonobstructive right lower pole renal stone. No ?obstructive ureteral or bladder calculi identified. ?2. Several punctate calcifications in the left hemipelvis along the ?course of the left ureter, none of which definitely demonstrate a ?rim of soft tissue or can be confidently located within the left ?ureter. These likely reflect phleboliths however a nonobstructive ?ureteral calculus is still a differential consideration. ?3. Gravid uterus with baby in cephalad position, not well evaluated ?on this study. ? ? ?Electronically Signed ?By: Maudry MayhewJeffrey  Waltz M.D. ?On: 11/26/2021 18:39  ? ?Results for orders placed or performed during the hospital encounter of 11/26/21 (from the past 24 hour(s))  ?Urinalysis, Routine w reflex microscopic Urine, Clean Catch     Status: Abnormal  ? Collection Time: 11/26/21  3:05 PM  ?Result Value Ref Range  ? Color, Urine AMBER (A) YELLOW  ? APPearance CLOUDY (A) CLEAR  ? Specific Gravity, Urine 1.032 (H) 1.005 - 1.030  ? pH 5.0 5.0 - 8.0  ? Glucose, UA NEGATIVE NEGATIVE mg/dL  ? Hgb urine dipstick NEGATIVE NEGATIVE  ? Bilirubin Urine NEGATIVE NEGATIVE  ? Ketones, ur 80 (A) NEGATIVE mg/dL  ? Protein, ur 100 (A) NEGATIVE mg/dL  ? Nitrite NEGATIVE NEGATIVE  ? Leukocytes,Ua NEGATIVE NEGATIVE  ? RBC / HPF 0-5 0 - 5 RBC/hpf  ? WBC, UA 11-20 0 - 5 WBC/hpf  ? Bacteria, UA FEW (A) NONE SEEN  ? Squamous Epithelial / LPF 21-50 0 - 5  ? Mucus PRESENT   ? Hyaline Casts, UA PRESENT   ?CBC     Status: Abnormal  ? Collection Time: 11/26/21  3:30 PM  ?Result Value Ref Range  ? WBC 11.4 (H) 4.0 - 10.5 K/uL  ? RBC 4.06 3.87 - 5.11 MIL/uL  ? Hemoglobin 8.4 (L) 12.0 - 15.0 g/dL  ? HCT 29.6 (L) 36.0 - 46.0 %  ? MCV 72.9  (L) 80.0 - 100.0 fL  ? MCH 20.7 (L) 26.0 - 34.0 pg  ? MCHC 28.4 (L) 30.0 - 36.0 g/dL  ? RDW 17.7 (H) 11.5 - 15.5 %  ? Platelets 346 150 - 400 K/uL  ? nRBC 1.0 (H) 0.0 - 0.2 %  ?Comprehensive metabolic panel     Status: Abnormal  ? Collection Time: 11/26/21  3:30 PM  ?Result Value Ref Range  ? Sodium 134 (L) 135 - 145 mmol/L  ? Potassium 3.3 (L) 3.5 - 5.1 mmol/L  ? Chloride 102 98 - 111 mmol/L  ? CO2 21 (L) 22 - 32 mmol/L  ? Glucose, Bld 86 70 - 99 mg/dL  ? BUN 5 (L) 6 - 20 mg/dL  ? Creatinine, Ser 0.56 0.44 - 1.00 mg/dL  ? Calcium 8.7 (L) 8.9 - 10.3 mg/dL  ? Total Protein 6.5 6.5 - 8.1 g/dL  ? Albumin 2.6 (L) 3.5 - 5.0 g/dL  ? AST 19 15 - 41 U/L  ? ALT 13 0 - 44  U/L  ? Alkaline Phosphatase 183 (H) 38 - 126 U/L  ? Total Bilirubin 1.2 0.3 - 1.2 mg/dL  ? GFR, Estimated >60 >60 mL/min  ? Anion gap 11 5 - 15  ? ? ? ?Assessment/Plan: ?1) Admit to antepartum for IVF hydration and pain management ?2) Per discussion with Dr. Senaida Ores, the possibility of proceeding with delivery was discussed. The patient does not want to immediately proceed with induction due to her husband needing to be home to care for their children while family is en route to assist with child care.  ? ?Maria Mercado ?11/26/2021, 6:57 PM ? ? ? ? ?

## 2021-11-26 NOTE — MAU Note (Signed)
Patient returned from CT

## 2021-11-26 NOTE — MAU Note (Signed)
CT placing transport for patient. ?

## 2021-11-26 NOTE — MAU Note (Signed)
Informed consent for CT Scan signed by patient and Maryelizabeth Kaufmann, CNM.  ?

## 2021-11-26 NOTE — MAU Note (Signed)
...  Maria Mercado is a 31 y.o. at [redacted]w[redacted]d here in MAU reporting: Sent here for evaluation by Urology per her OB. She states she was diagnosed with a kidney stone in her left kidney this past Saturday. She rates her left mid back pain a 4/10 and states it is constant and stabbing. Endorses light nausea. +FM. No VB or LOF.  ? ?Last doses: ?Oxycodone - 1100 today ?Tylenol - 1100 today ?Flomax - 0800 today ?Phenergan - 0830 today ? ?Pain score:  ?4/10 left mid back ? ?FHT: 145 initial external ?Lab orders placed from triage: UA ? ?

## 2021-11-27 DIAGNOSIS — Z3A37 37 weeks gestation of pregnancy: Secondary | ICD-10-CM | POA: Diagnosis not present

## 2021-11-27 DIAGNOSIS — O212 Late vomiting of pregnancy: Secondary | ICD-10-CM | POA: Diagnosis present

## 2021-11-27 DIAGNOSIS — N202 Calculus of kidney with calculus of ureter: Secondary | ICD-10-CM | POA: Diagnosis present

## 2021-11-27 DIAGNOSIS — O26833 Pregnancy related renal disease, third trimester: Secondary | ICD-10-CM | POA: Diagnosis present

## 2021-11-27 LAB — CULTURE, OB URINE: Culture: 60000 — AB

## 2021-11-27 MED ORDER — TAMSULOSIN HCL 0.4 MG PO CAPS
0.4000 mg | ORAL_CAPSULE | Freq: Every day | ORAL | Status: DC
  Administered 2021-11-27 – 2021-11-28 (×2): 0.4 mg via ORAL
  Filled 2021-11-27 (×3): qty 1

## 2021-11-27 NOTE — Progress Notes (Signed)
HD #2, [redacted]W[redacted]D, flank pain/kidney stones ?Still with some pain but better than yesterday, +FM, no ctx ?Afeb, VSS ?Fundus NT ?FHT-reactive last pm ? ?Will continue hydration and pain management for now.  Her family will not be in town until 3-25 so would like to hold off on induction until at least then if possible.  Will reassess this pm ?

## 2021-11-27 NOTE — Progress Notes (Signed)
Still having pain, some nausea, and discolored urine, but doing ok so far today without IV pain meds, no emesis ?Afeb, VSS, remains with mild tachycardia 110s ?FHT-130s, reactive around 1200 ? ?Will change to inpatient to continue hydration and pain and nausea control, consider discharge tomorrow if remains stable.  No indication currently to proceed with labor induction ?

## 2021-11-28 MED ORDER — OXYCODONE HCL 5 MG PO TABS: 5.0000 mg | ORAL_TABLET | ORAL | 0 refills | Status: DC | PRN

## 2021-11-28 NOTE — Plan of Care (Signed)

## 2021-11-28 NOTE — Progress Notes (Signed)
Patient seen and examined.  Her pain is much improved.  Has mild nausea, but feels that it is now at her baseline level (daily nausea throughout pregnancy).  She denies contractions or vaginal bleeding.  +FM ? ?BP (!) 95/57 (BP Location: Left Arm)   Pulse (!) 109   Temp 98.2 ?F (36.8 ?C) (Oral)   Resp 16   LMP 03/08/2021   SpO2 98%  ?NAD ?Abdomen: gravid ?Trace LE edema ? ?NST reactive, no decelerations ? ?A/P:  [id number] @ [redacted]w[redacted]d with kidney stones ?Pain now managed with PO pain medication.  Will discharge to home to await labor ?

## 2021-11-28 NOTE — Discharge Summary (Signed)
Physician Discharge Summary  ?Patient ID: ?Maria Mercado ?MRN: [id number] ?DOB/AGE: [birth year] 29 y.o. ? ?Admit date: 11/26/2021 ?Discharge date: 11/28/2021 ? ?Admission Diagnoses: ? ?Discharge Diagnoses:  ?Principal Problem: ?  Flank pain in pregnant patient ? ? ?Discharged Condition: good ? ?Hospital Course: 31 yo [id number] @ 37+4 presents for evaluation of persistent flank pain, nausea and vomiting.  The patient was originally evaluated on 11/23/2021 for similar complaints and was diagnosed with a possible nonobstructing kidney stone on the left side.  She was discharged home with pain medication.  She returned on 3/19 with similar complaints and was again discharged home on Flomax and pain medication.  Patient was then evaluated by urology today [facility] which showed similar findings to the 2 prior renal ultrasounds.  She was sent to maternity admissions for additional evaluation for nausea, vomiting with inability to keep fluids down and continued need for pain management.  She had a CT scan in MAU that demonstrated punctate nonobstructive right lower pole stone ? ?She was admitted to the antepartum unit.  She received IV pain medications and anti-emetics.  She was then transitioned to PO pain medications.  Her pain improved to a more manageable level.  On HD#3, she was meeting all goals and was discharged to home with PO oxycodone and flomax. Fetal status remained reassuring throughout the hospital stay. She will follow up with Dr. Senaida Ores as previously scheduled next week.   ? ?Consults: None ? ?Significant Diagnostic Studies: CT renal stone as abo ve ? ?Treatments: IVF, IV pain medication and antiemetics ? ?Discharge Exam: ?Blood pressure (!) 95/57, pulse (!) 109, temperature 98.2 ?F (36.8 ?C), temperature source Oral, resp. rate 16, last menstrual period 03/08/2021, SpO2 98 %, unknown if currently breastfeeding. ?NAD ?Gravid abdomen, soft, NT ?Ext: trace edema bilaterally ? ?Disposition: Discharge  disposition: 01-Home or Self Care ? ? ? ? ? ? ?Discharge Instructions   ? ? Discharge activity:  No Restrictions   Complete by: As directed ?  ? Fetal Kick Count:  Lie on our left side for one hour after a meal, and count the number of times your baby kicks.  If it is less than 5 times, get up, move around and drink some juice.  Repeat the test 30 minutes later.  If it is still less than 5 kicks in an hour, notify your doctor.   Complete by: As directed ?  ? LABOR:  When conractions begin, you should start to time them from the beginning of one contraction to the beginning  of the next.  When contractions are 5 - 10 minutes apart or less and have been regular for at least an hour, you should call your health care provider.   Complete by: As directed ?  ? Notify physician for bleeding from the vagina   Complete by: As directed ?  ? Notify physician for blurring of vision or spots before the eyes   Complete by: As directed ?  ? Notify physician for chills or fever   Complete by: As directed ?  ? Notify physician for fainting spells, "black outs" or loss of consciousness   Complete by: As directed ?  ? Notify physician for increase in vaginal discharge   Complete by: As directed ?  ? Notify physician for leaking of fluid   Complete by: As directed ?  ? Notify physician for pain or burning when urinating   Complete by: As directed ?  ? Notify physician for pelvic pressure (sudden increase)  Complete by: As directed ?  ? Notify physician for severe or continued nausea or vomiting   Complete by: As directed ?  ? Notify physician for sudden gushing of fluid from the vagina (with or without continued leaking)   Complete by: As directed ?  ? Notify physician for sudden, constant, or occasional abdominal pain   Complete by: As directed ?  ? Notify physician if baby moving less than usual   Complete by: As directed ?  ? ?  ? ?Allergies as of 11/28/2021   ? ?   Reactions  ? Keflex [cephalexin] Itching  ? Has taken Keflex but  was okay, except for one generic brand that made her itch.   ? ?  ? ?  ?Medication List  ?  ? ?STOP taking these medications   ? ?butalbital-acetaminophen-caffeine 50-325-40 MG tablet ?Commonly known as: FIORICET ?  ?clindamycin 300 MG capsule ?Commonly known as: CLEOCIN ?  ?doxylamine (Sleep) 25 MG tablet ?Commonly known as: UNISOM ?  ?medroxyPROGESTERone 10 MG tablet ?Commonly known as: PROVERA ?  ?metFORMIN 1000 MG tablet ?Commonly known as: GLUCOPHAGE ?  ?progesterone 200 MG capsule ?Commonly known as: PROMETRIUM ?  ?Vitamin D (Ergocalciferol) 1.25 MG (50000 UNIT) Caps capsule ?Commonly known as: DRISDOL ?  ? ?  ? ?TAKE these medications   ? ?acetaminophen 325 MG tablet ?Commonly known as: Tylenol ?Take 2 tablets (650 mg total) by mouth every 4 (four) hours for 10 days. ?  ?albuterol 108 (90 Base) MCG/ACT inhaler ?Commonly known as: VENTOLIN HFA ?Inhale 2 puffs into the lungs every 4 (four) hours as needed for wheezing or shortness of breath. ?  ?FLUoxetine 20 MG tablet ?Commonly known as: PROZAC ?Take 20 mg by mouth daily. ?  ?metoCLOPramide 10 MG tablet ?Commonly known as: REGLAN ?Take 1 tablet by mouth 4 (four) times daily. ?  ?ondansetron 8 MG disintegrating tablet ?Commonly known as: ZOFRAN-ODT ?Place 1 tablet inside cheek 3 (three) times daily as needed. ?  ?oxyCODONE 5 MG immediate release tablet ?Commonly known as: Roxicodone ?Take 1 tablet (5 mg total) by mouth every 4 (four) hours as needed for severe pain. ?What changed: Another medication with the same name was removed. Continue taking this medication, and follow the directions you see here. ?  ?pantoprazole 40 MG tablet ?Commonly known as: PROTONIX ?Take 1 tablet by mouth daily. ?  ?polyethylene glycol 17 g packet ?Commonly known as: MIRALAX / GLYCOLAX ?Take 17 g by mouth daily. ?  ?Prenatal 27-1 MG Tabs ?Take 1 tablet by mouth daily. ?  ?promethazine 12.5 MG tablet ?Commonly known as: PHENERGAN ?Take 1 tablet (12.5 mg total) by mouth every 8  (eight) hours as needed (cramping with stone passing). ?  ?tamsulosin 0.4 MG Caps capsule ?Commonly known as: FLOMAX ?Take 1 capsule (0.4 mg total) by mouth daily. Take once a day for 14 days or until stone passes ?  ? ?  ? ? ? ?Signed: ?[place] ?11/28/2021, 3:19 PM ? ? ?

## 2021-12-05 ENCOUNTER — Encounter (HOSPITAL_COMMUNITY): Payer: Self-pay | Admitting: *Deleted

## 2021-12-05 ENCOUNTER — Telehealth (HOSPITAL_COMMUNITY): Payer: Self-pay | Admitting: *Deleted

## 2021-12-05 NOTE — Telephone Encounter (Signed)
Preadmission screen  

## 2021-12-16 ENCOUNTER — Encounter (HOSPITAL_COMMUNITY)
Admission: AD | Disposition: A | Discharge status: Home / Self Care | Payer: Self-pay | Source: Home / Self Care | Attending: Obstetrics and Gynecology

## 2021-12-16 ENCOUNTER — Telehealth (HOSPITAL_COMMUNITY): Payer: Self-pay | Admitting: *Deleted

## 2021-12-16 ENCOUNTER — Other Ambulatory Visit: Payer: Self-pay

## 2021-12-16 ENCOUNTER — Encounter (HOSPITAL_COMMUNITY): Payer: Self-pay | Admitting: Obstetrics and Gynecology

## 2021-12-16 ENCOUNTER — Inpatient Hospital Stay (HOSPITAL_COMMUNITY): Payer: No Typology Code available for payment source | Admitting: Anesthesiology

## 2021-12-16 ENCOUNTER — Inpatient Hospital Stay (HOSPITAL_COMMUNITY)
Admission: AD | Admit: 2021-12-16 | Discharge: 2021-12-19 | DRG: 787 | Disposition: A | Discharge status: Home / Self Care | Payer: No Typology Code available for payment source | Attending: Obstetrics and Gynecology | Admitting: Obstetrics and Gynecology

## 2021-12-16 ENCOUNTER — Inpatient Hospital Stay (HOSPITAL_COMMUNITY): Admission: AD | Admit: 2021-12-16 | Source: Home / Self Care | Admitting: Obstetrics and Gynecology

## 2021-12-16 DIAGNOSIS — Z3689 Encounter for other specified antenatal screening: Secondary | ICD-10-CM

## 2021-12-16 DIAGNOSIS — D649 Anemia, unspecified: Secondary | ICD-10-CM | POA: Diagnosis not present

## 2021-12-16 DIAGNOSIS — Z3A40 40 weeks gestation of pregnancy: Secondary | ICD-10-CM

## 2021-12-16 DIAGNOSIS — O9902 Anemia complicating childbirth: Secondary | ICD-10-CM | POA: Diagnosis not present

## 2021-12-16 DIAGNOSIS — O480 Post-term pregnancy: Principal | ICD-10-CM | POA: Diagnosis present

## 2021-12-16 DIAGNOSIS — O26893 Other specified pregnancy related conditions, third trimester: Secondary | ICD-10-CM | POA: Diagnosis present

## 2021-12-16 DIAGNOSIS — O321XX0 Maternal care for breech presentation, not applicable or unspecified: Secondary | ICD-10-CM | POA: Diagnosis present

## 2021-12-16 DIAGNOSIS — O9081 Anemia of the puerperium: Secondary | ICD-10-CM | POA: Diagnosis not present

## 2021-12-16 DIAGNOSIS — D62 Acute posthemorrhagic anemia: Secondary | ICD-10-CM | POA: Diagnosis not present

## 2021-12-16 DIAGNOSIS — O321XX1 Maternal care for breech presentation, fetus 1: Secondary | ICD-10-CM | POA: Diagnosis present

## 2021-12-16 LAB — CBC
HCT: 29.8 % — ABNORMAL LOW (ref 36.0–46.0)
Hemoglobin: 8.4 g/dL — ABNORMAL LOW (ref 12.0–15.0)
MCH: 20 pg — ABNORMAL LOW (ref 26.0–34.0)
MCHC: 28.2 g/dL — ABNORMAL LOW (ref 30.0–36.0)
MCV: 70.8 fL — ABNORMAL LOW (ref 80.0–100.0)
Platelets: 373 10*3/uL (ref 150–400)
RBC: 4.21 MIL/uL (ref 3.87–5.11)
RDW: 18.6 % — ABNORMAL HIGH (ref 11.5–15.5)
WBC: 11.6 10*3/uL — ABNORMAL HIGH (ref 4.0–10.5)
nRBC: 0.4 % — ABNORMAL HIGH (ref 0.0–0.2)

## 2021-12-16 LAB — TYPE AND SCREEN
ABO/RH(D): O POS
Antibody Screen: NEGATIVE

## 2021-12-16 SURGERY — Surgical Case
Anesthesia: Spinal

## 2021-12-16 MED ORDER — DEXMEDETOMIDINE (PRECEDEX) IN NS 20 MCG/5ML (4 MCG/ML) IV SYRINGE
PREFILLED_SYRINGE | INTRAVENOUS | Status: AC
Start: 2021-12-16 — End: ?
  Filled 2021-12-16: qty 5

## 2021-12-16 MED ORDER — ONDANSETRON HCL 4 MG/2ML IJ SOLN
INTRAMUSCULAR | Status: AC
  Filled 2021-12-16: qty 2

## 2021-12-16 MED ORDER — COCONUT OIL OIL: 1.0000 "application " | TOPICAL_OIL | Status: DC | PRN

## 2021-12-16 MED ORDER — DIBUCAINE (PERIANAL) 1 % EX OINT: 1.0000 "application " | TOPICAL_OINTMENT | CUTANEOUS | Status: DC | PRN

## 2021-12-16 MED ORDER — ZOLPIDEM TARTRATE 5 MG PO TABS: 5.0000 mg | ORAL_TABLET | Freq: Every evening | ORAL | Status: DC | PRN

## 2021-12-16 MED ORDER — MORPHINE SULFATE (PF) 0.5 MG/ML IJ SOLN
INTRAMUSCULAR | Status: DC | PRN
  Administered 2021-12-16: 150 ug via INTRATHECAL

## 2021-12-16 MED ORDER — SODIUM CHLORIDE 0.9 % IR SOLN
Status: DC | PRN
  Administered 2021-12-16: 1

## 2021-12-16 MED ORDER — ONDANSETRON HCL 4 MG/2ML IJ SOLN
INTRAMUSCULAR | Status: DC | PRN
  Administered 2021-12-16: 4 mg via INTRAVENOUS

## 2021-12-16 MED ORDER — DEXAMETHASONE SODIUM PHOSPHATE 4 MG/ML IJ SOLN
INTRAMUSCULAR | Status: AC
  Filled 2021-12-16: qty 1

## 2021-12-16 MED ORDER — OXYCODONE-ACETAMINOPHEN 5-325 MG PO TABS: 2.0000 | ORAL_TABLET | ORAL | Status: DC | PRN

## 2021-12-16 MED ORDER — SCOPOLAMINE 1 MG/3DAYS TD PT72
1.0000 | MEDICATED_PATCH | Freq: Once | TRANSDERMAL | Status: DC
  Administered 2021-12-16: 1.5 mg via TRANSDERMAL

## 2021-12-16 MED ORDER — MORPHINE SULFATE (PF) 0.5 MG/ML IJ SOLN
INTRAMUSCULAR | Status: AC
  Filled 2021-12-16: qty 10

## 2021-12-16 MED ORDER — SOD CITRATE-CITRIC ACID 500-334 MG/5ML PO SOLN
30.0000 mL | Freq: Once | ORAL | Status: AC
  Administered 2021-12-16: 30 mL via ORAL

## 2021-12-16 MED ORDER — DIPHENHYDRAMINE HCL 25 MG PO CAPS: 25.0000 mg | ORAL_CAPSULE | Freq: Four times a day (QID) | ORAL | Status: DC | PRN

## 2021-12-16 MED ORDER — ACETAMINOPHEN 500 MG PO TABS
1000.0000 mg | ORAL_TABLET | Freq: Four times a day (QID) | ORAL | Status: DC
  Administered 2021-12-17 – 2021-12-19 (×8): 1000 mg via ORAL
  Filled 2021-12-16 (×9): qty 2

## 2021-12-16 MED ORDER — MENTHOL 3 MG MT LOZG: 1.0000 | LOZENGE | OROMUCOSAL | Status: DC | PRN

## 2021-12-16 MED ORDER — VANCOMYCIN HCL IN DEXTROSE 1-5 GM/200ML-% IV SOLN
1000.0000 mg | Freq: Once | INTRAVENOUS | Status: AC
  Administered 2021-12-16: 1000 mg via INTRAVENOUS
  Filled 2021-12-16: qty 200

## 2021-12-16 MED ORDER — OXYTOCIN-SODIUM CHLORIDE 30-0.9 UT/500ML-% IV SOLN
INTRAVENOUS | Status: DC | PRN
  Administered 2021-12-16: 300 mL via INTRAVENOUS

## 2021-12-16 MED ORDER — OXYTOCIN-SODIUM CHLORIDE 30-0.9 UT/500ML-% IV SOLN
INTRAVENOUS | Status: AC
  Filled 2021-12-16: qty 500

## 2021-12-16 MED ORDER — FENTANYL CITRATE (PF) 100 MCG/2ML IJ SOLN: 50.0000 ug | INTRAMUSCULAR | Status: DC | PRN

## 2021-12-16 MED ORDER — HYDROMORPHONE HCL 1 MG/ML IJ SOLN
INTRAMUSCULAR | Status: AC
  Filled 2021-12-16: qty 0.5

## 2021-12-16 MED ORDER — TETANUS-DIPHTH-ACELL PERTUSSIS 5-2.5-18.5 LF-MCG/0.5 IM SUSY: 0.5000 mL | PREFILLED_SYRINGE | Freq: Once | INTRAMUSCULAR | Status: DC

## 2021-12-16 MED ORDER — OXYCODONE HCL 5 MG PO TABS: 5.0000 mg | ORAL_TABLET | Freq: Once | ORAL | Status: DC | PRN

## 2021-12-16 MED ORDER — OXYCODONE HCL 5 MG/5ML PO SOLN: 5.0000 mg | Freq: Once | ORAL | Status: DC | PRN

## 2021-12-16 MED ORDER — METHYLERGONOVINE MALEATE 0.2 MG PO TABS: 0.2000 mg | ORAL_TABLET | ORAL | Status: DC | PRN

## 2021-12-16 MED ORDER — NALOXONE HCL 4 MG/10ML IJ SOLN
1.0000 ug/kg/h | INTRAVENOUS | Status: DC | PRN
  Filled 2021-12-16: qty 5

## 2021-12-16 MED ORDER — GENTAMICIN SULFATE 40 MG/ML IJ SOLN
5.0000 mg/kg | INTRAVENOUS | Status: AC
  Administered 2021-12-16: 340 mg via INTRAVENOUS
  Filled 2021-12-16: qty 8.5

## 2021-12-16 MED ORDER — DEXAMETHASONE SODIUM PHOSPHATE 10 MG/ML IJ SOLN
INTRAMUSCULAR | Status: DC | PRN
  Administered 2021-12-16: 4 mg via INTRAVENOUS

## 2021-12-16 MED ORDER — FLUOXETINE HCL 20 MG PO CAPS
20.0000 mg | ORAL_CAPSULE | Freq: Every day | ORAL | Status: DC
  Administered 2021-12-17 – 2021-12-19 (×3): 20 mg via ORAL
  Filled 2021-12-16 (×3): qty 1

## 2021-12-16 MED ORDER — METHYLERGONOVINE MALEATE 0.2 MG/ML IJ SOLN: 0.2000 mg | INTRAMUSCULAR | Status: DC | PRN

## 2021-12-16 MED ORDER — WITCH HAZEL-GLYCERIN EX PADS: 1.0000 "application " | MEDICATED_PAD | CUTANEOUS | Status: DC | PRN

## 2021-12-16 MED ORDER — SCOPOLAMINE 1 MG/3DAYS TD PT72
MEDICATED_PATCH | TRANSDERMAL | Status: AC
  Filled 2021-12-16: qty 1

## 2021-12-16 MED ORDER — PHENYLEPHRINE HCL-NACL 20-0.9 MG/250ML-% IV SOLN
INTRAVENOUS | Status: DC | PRN
  Administered 2021-12-16: 50 ug/min via INTRAVENOUS

## 2021-12-16 MED ORDER — OXYTOCIN-SODIUM CHLORIDE 30-0.9 UT/500ML-% IV SOLN
2.5000 [IU]/h | INTRAVENOUS | Status: DC
  Administered 2021-12-16: 2.5 [IU]/h via INTRAVENOUS
  Filled 2021-12-16: qty 500

## 2021-12-16 MED ORDER — DEXMEDETOMIDINE (PRECEDEX) IN NS 20 MCG/5ML (4 MCG/ML) IV SYRINGE
PREFILLED_SYRINGE | INTRAVENOUS | Status: DC | PRN
  Administered 2021-12-16: 8 ug via INTRAVENOUS

## 2021-12-16 MED ORDER — IBUPROFEN 600 MG PO TABS
600.0000 mg | ORAL_TABLET | Freq: Four times a day (QID) | ORAL | Status: DC
  Administered 2021-12-16 – 2021-12-19 (×10): 600 mg via ORAL
  Filled 2021-12-16 (×9): qty 1

## 2021-12-16 MED ORDER — BUPIVACAINE IN DEXTROSE 0.75-8.25 % IT SOLN
INTRATHECAL | Status: DC | PRN
  Administered 2021-12-16: 12 mg via INTRATHECAL

## 2021-12-16 MED ORDER — TERBUTALINE SULFATE 1 MG/ML IJ SOLN
INTRAMUSCULAR | Status: AC
  Filled 2021-12-16: qty 1

## 2021-12-16 MED ORDER — DIPHENHYDRAMINE HCL 25 MG PO CAPS
25.0000 mg | ORAL_CAPSULE | ORAL | Status: DC | PRN
Start: 2021-12-16 — End: 2021-12-19

## 2021-12-16 MED ORDER — OXYTOCIN BOLUS FROM INFUSION: 333.0000 mL | Freq: Once | INTRAVENOUS | Status: DC

## 2021-12-16 MED ORDER — HYDROMORPHONE HCL 1 MG/ML IJ SOLN
0.2500 mg | INTRAMUSCULAR | Status: DC | PRN
  Administered 2021-12-16: 0.25 mg via INTRAVENOUS

## 2021-12-16 MED ORDER — LIDOCAINE HCL (PF) 1 % IJ SOLN
30.0000 mL | INTRAMUSCULAR | Status: DC | PRN
  Filled 2021-12-16: qty 30

## 2021-12-16 MED ORDER — ONDANSETRON HCL 4 MG/2ML IJ SOLN: 4.0000 mg | Freq: Once | INTRAMUSCULAR | Status: DC | PRN

## 2021-12-16 MED ORDER — PHENYLEPHRINE HCL-NACL 20-0.9 MG/250ML-% IV SOLN: INTRAVENOUS | Status: DC | PRN

## 2021-12-16 MED ORDER — NALOXONE HCL 0.4 MG/ML IJ SOLN: 0.4000 mg | INTRAMUSCULAR | Status: DC | PRN

## 2021-12-16 MED ORDER — OXYCODONE HCL 5 MG PO TABS
5.0000 mg | ORAL_TABLET | ORAL | Status: DC | PRN
  Administered 2021-12-18 (×2): 5 mg via ORAL
  Filled 2021-12-16 (×2): qty 1

## 2021-12-16 MED ORDER — FENTANYL CITRATE (PF) 100 MCG/2ML IJ SOLN
INTRAMUSCULAR | Status: DC | PRN
  Administered 2021-12-16: 15 ug via INTRATHECAL

## 2021-12-16 MED ORDER — ONDANSETRON HCL 4 MG/2ML IJ SOLN: 4.0000 mg | Freq: Three times a day (TID) | INTRAMUSCULAR | Status: DC | PRN

## 2021-12-16 MED ORDER — STERILE WATER FOR IRRIGATION IR SOLN
Status: DC | PRN
  Administered 2021-12-16: 1000 mL

## 2021-12-16 MED ORDER — TERBUTALINE SULFATE 1 MG/ML IJ SOLN
0.2500 mg | Freq: Once | INTRAMUSCULAR | Status: AC
  Administered 2021-12-16: 0.25 mg via SUBCUTANEOUS

## 2021-12-16 MED ORDER — FENTANYL CITRATE (PF) 100 MCG/2ML IJ SOLN
INTRAMUSCULAR | Status: AC
  Filled 2021-12-16: qty 2

## 2021-12-16 MED ORDER — KETOROLAC TROMETHAMINE 30 MG/ML IJ SOLN: 30.0000 mg | Freq: Once | INTRAMUSCULAR | Status: DC | PRN

## 2021-12-16 MED ORDER — OXYCODONE-ACETAMINOPHEN 5-325 MG PO TABS: 1.0000 | ORAL_TABLET | ORAL | Status: DC | PRN

## 2021-12-16 MED ORDER — PRENATAL MULTIVITAMIN CH
1.0000 | ORAL_TABLET | Freq: Every day | ORAL | Status: DC
  Administered 2021-12-17 – 2021-12-18 (×2): 1 via ORAL
  Filled 2021-12-16 (×2): qty 1

## 2021-12-16 MED ORDER — FAMOTIDINE IN NACL 20-0.9 MG/50ML-% IV SOLN
20.0000 mg | Freq: Once | INTRAVENOUS | Status: AC
  Administered 2021-12-16: 20 mg via INTRAVENOUS
  Filled 2021-12-16 (×2): qty 50

## 2021-12-16 MED ORDER — SOD CITRATE-CITRIC ACID 500-334 MG/5ML PO SOLN: 30.0000 mL | ORAL | Status: DC | PRN

## 2021-12-16 MED ORDER — ONDANSETRON HCL 4 MG/2ML IJ SOLN: 4.0000 mg | Freq: Four times a day (QID) | INTRAMUSCULAR | Status: DC | PRN

## 2021-12-16 MED ORDER — SOD CITRATE-CITRIC ACID 500-334 MG/5ML PO SOLN
ORAL | Status: AC
  Filled 2021-12-16: qty 30

## 2021-12-16 MED ORDER — PHENYLEPHRINE HCL-NACL 20-0.9 MG/250ML-% IV SOLN
INTRAVENOUS | Status: AC
Start: 2021-12-16 — End: ?
  Filled 2021-12-16: qty 250

## 2021-12-16 MED ORDER — LACTATED RINGERS IV SOLN: INTRAVENOUS | Status: DC

## 2021-12-16 MED ORDER — DIPHENHYDRAMINE HCL 50 MG/ML IJ SOLN: 12.5000 mg | INTRAMUSCULAR | Status: DC | PRN

## 2021-12-16 MED ORDER — ALBUTEROL SULFATE (2.5 MG/3ML) 0.083% IN NEBU: 3.0000 mL | INHALATION_SOLUTION | RESPIRATORY_TRACT | Status: DC | PRN

## 2021-12-16 MED ORDER — ACETAMINOPHEN 325 MG PO TABS: 650.0000 mg | ORAL_TABLET | ORAL | Status: DC | PRN

## 2021-12-16 MED ORDER — SIMETHICONE 80 MG PO CHEW: 80.0000 mg | CHEWABLE_TABLET | ORAL | Status: DC | PRN

## 2021-12-16 MED ORDER — SODIUM CHLORIDE 0.9% FLUSH: 3.0000 mL | INTRAVENOUS | Status: DC | PRN

## 2021-12-16 MED ORDER — SENNOSIDES-DOCUSATE SODIUM 8.6-50 MG PO TABS
2.0000 | ORAL_TABLET | Freq: Every day | ORAL | Status: DC
  Administered 2021-12-17 – 2021-12-19 (×3): 2 via ORAL
  Filled 2021-12-16 (×3): qty 2

## 2021-12-16 MED ORDER — POVIDONE-IODINE 10 % EX SWAB
2.0000 "application " | Freq: Once | CUTANEOUS | Status: AC
  Administered 2021-12-16: 2 via TOPICAL

## 2021-12-16 MED ORDER — OXYTOCIN-SODIUM CHLORIDE 30-0.9 UT/500ML-% IV SOLN
2.5000 [IU]/h | INTRAVENOUS | Status: DC
  Administered 2021-12-16: 2.5 [IU]/h via INTRAVENOUS

## 2021-12-16 MED ORDER — LACTATED RINGERS IV SOLN: 500.0000 mL | INTRAVENOUS | Status: DC | PRN

## 2021-12-16 MED ORDER — SIMETHICONE 80 MG PO CHEW
80.0000 mg | CHEWABLE_TABLET | Freq: Three times a day (TID) | ORAL | Status: DC
  Administered 2021-12-17 – 2021-12-19 (×6): 80 mg via ORAL
  Filled 2021-12-16 (×6): qty 1

## 2021-12-16 MED ORDER — MEPERIDINE HCL 25 MG/ML IJ SOLN: 6.2500 mg | INTRAMUSCULAR | Status: DC | PRN

## 2021-12-16 SURGICAL SUPPLY — 35 items
BENZOIN TINCTURE PRP APPL 2/3 (GAUZE/BANDAGES/DRESSINGS) ×1 IMPLANT
CHLORAPREP W/TINT 26ML (MISCELLANEOUS) ×4 IMPLANT
CLAMP CORD UMBIL (MISCELLANEOUS) ×2 IMPLANT
CLOTH BEACON ORANGE TIMEOUT ST (SAFETY) ×2 IMPLANT
DERMABOND ADVANCED (GAUZE/BANDAGES/DRESSINGS) ×1
DERMABOND ADVANCED .7 DNX12 (GAUZE/BANDAGES/DRESSINGS) IMPLANT
DRSG OPSITE POSTOP 4X10 (GAUZE/BANDAGES/DRESSINGS) ×2 IMPLANT
ELECT REM PT RETURN 9FT ADLT (ELECTROSURGICAL)
ELECTRODE REM PT RTRN 9FT ADLT (ELECTROSURGICAL) ×1 IMPLANT
EXTRACTOR VACUUM KIWI (MISCELLANEOUS) IMPLANT
GLOVE BIO SURGEON STRL SZ 6.5 (GLOVE) ×2 IMPLANT
GLOVE BIOGEL PI IND STRL 7.0 (GLOVE) ×1 IMPLANT
GLOVE BIOGEL PI INDICATOR 7.0 (GLOVE) ×1
GOWN STRL REUS W/TWL LRG LVL3 (GOWN DISPOSABLE) ×4 IMPLANT
KIT ABG SYR 3ML LUER SLIP (SYRINGE) IMPLANT
NDL HYPO 25X5/8 SAFETYGLIDE (NEEDLE) IMPLANT
NEEDLE HYPO 25X5/8 SAFETYGLIDE (NEEDLE) IMPLANT
NS IRRIG 1000ML POUR BTL (IV SOLUTION) ×2 IMPLANT
PACK C SECTION WH (CUSTOM PROCEDURE TRAY) ×2 IMPLANT
PAD OB MATERNITY 4.3X12.25 (PERSONAL CARE ITEMS) ×2 IMPLANT
RTRCTR C-SECT PINK 25CM LRG (MISCELLANEOUS) ×2 IMPLANT
STRIP CLOSURE SKIN 1/2X4 (GAUZE/BANDAGES/DRESSINGS) ×1 IMPLANT
SUT CHROMIC 1 CTX 36 (SUTURE) ×7 IMPLANT
SUT PLAIN 0 NONE (SUTURE) IMPLANT
SUT PLAIN 2 0 XLH (SUTURE) ×3 IMPLANT
SUT VIC AB 0 CT1 27 (SUTURE) ×2
SUT VIC AB 0 CT1 27XBRD ANBCTR (SUTURE) ×2 IMPLANT
SUT VIC AB 2-0 CT1 27 (SUTURE) ×1
SUT VIC AB 2-0 CT1 TAPERPNT 27 (SUTURE) ×1 IMPLANT
SUT VIC AB 3-0 CT1 27 (SUTURE)
SUT VIC AB 3-0 CT1 TAPERPNT 27 (SUTURE) IMPLANT
SUT VIC AB 4-0 KS 27 (SUTURE) ×2 IMPLANT
TOWEL OR 17X24 6PK STRL BLUE (TOWEL DISPOSABLE) ×2 IMPLANT
TRAY FOLEY W/BAG SLVR 14FR LF (SET/KITS/TRAYS/PACK) ×2 IMPLANT
WATER STERILE IRR 1000ML POUR (IV SOLUTION) ×2 IMPLANT

## 2021-12-16 NOTE — Op Note (Signed)
Operative Report ? ? ? ?Pre-Operative Diagnosis: 1) 40+3-week intrauterine pregnancy 2) labor 3) frank breech presentation ? ?Postoperative Diagnosis: 1) 40+3-week intrauterine pregnancy 2) labor 3) frank breech presentation ? ?Procedure: Primary low transverse cesarean section ? ?Surgeon: Dr. Vanessa Kick ? ?Assistant: None ? ?Antibiotics: Gentamicin and vancomycin ? ?Operative Findings: Vigorous female infant in the frank breech presentation.  Moderate meconium stained fluid.  Apgar scores of 8 at 1 minute 9 at 5 minutes.  Normal-appearing ovaries, tubes, uterus. ? ?Specimen: Placenta for disposal ? ?[id number] I/O ?In: 1250 [I.V.:1000; IV Piggyback:250] ?Out: [id number] [Urine:150; Blood:1085] ? ? ?Procedure:Maria Mercado is an 31 year old gravida 4 para 2012 at 40 weeks and 3 days estimated gestational age who presents for cesarean section. Following the appropriate informed consent the patient was brought to the operating room where spinal anesthesia was administered and found to be adequate. She was placed in the dorsal supine position with a leftward tilt. She was prepped and draped in the normal sterile fashion.  The patient was appropriately identified during a preoperative timeout procedure. ? ?The scalpel was then used to make a Pfannenstiel skin incision which was carried down to the underlying layers of soft tissue to the fascia. The fascia was incised in the midline and the fascial incision was extended laterally with Mayo scissors. The superior aspect of the fascial incision was grasped with Coker clamps x2, tented up and the rectus muscles dissected off sharply with the electrocautery unit. The same procedure was repeated on the inferior aspect of the fascial incision. The rectus muscles were separated in the midline. The abdominal peritoneum was identified, tented up, entered sharply, and the incision was extended superiorly and inferiorly with good visualization of the bladder. The Alexis retractor was then  deployed. The vesicouterine peritoneum was identified, tented up, entered sharply, and the bladder flap was created digitally.  The scalpel was then used to make a low transverse incision on the uterus which was extended laterally with blunt dissection. The fetal breech was identified, delivered easily through the uterine incision followed by the body. The infant was bulb suctioned on the operative field and cried vigorously.  Following a 1 minute delay, the cord was clamped and cut. The infant was passed to the waiting neonatology team. Placenta was then delivered spontaneously and the uterus was cleared of all clot and debris. The uterine incision was repaired with #1 chromic in running locked fashion followed by a second imbricating layer. The ovaries and tubes were inspected and normal. The Alexis retractor was removed. The abdominal peritoneum was reapproximated with 2-0 Vicryl in a running fashion. The rectus muscles was reapproximated with 2-0 chromic in a running fashion. The fascia was closed with 0-looped PDS in a running fashion. The skin was closed with 4-0 vicryl in a subcuticular fashion and Dermabond. All laparotomy sponge, instrument, and needle counts were correct.  The patient tolerated the procedure well and was transferred to the recovery unit in stable condition following the procedure. ? ? ?

## 2021-12-16 NOTE — Anesthesia Preprocedure Evaluation (Addendum)
Anesthesia Evaluation  ?Patient identified by MRN, date of birth, ID band ?Patient awake ? ? ? ?Reviewed: ?Allergy & Precautions, NPO status , Patient's Chart, lab work & pertinent test results ? ?Airway ?Mallampati: II ? ?TM Distance: >3 FB ?Neck ROM: Full ? ? ? Dental ?no notable dental hx. ?(+) Teeth Intact, Dental Advisory Given ?  ?Pulmonary ?neg pulmonary ROS,  ?  ?Pulmonary exam normal ?breath sounds clear to auscultation ? ? ? ? ? ? Cardiovascular ?Exercise Tolerance: Good ?negative cardio ROS ?Normal cardiovascular exam ?Rhythm:Regular Rate:Normal ? ? ?  ?Neuro/Psych ?PSYCHIATRIC DISORDERS Anxiety negative neurological ROS ?   ? GI/Hepatic ?negative GI ROS, Neg liver ROS,   ?Endo/Other  ?negative endocrine ROS ? Renal/GU ?negative Renal ROS  ? ?  ?Musculoskeletal ? ? Abdominal ?  ?Peds ? Hematology ? ?(+) Blood dyscrasia, anemia , Lab Results ?     Component                Value               Date                 ?     WBC                      11.6 (H)            12/16/2021           ?     HGB                      8.4 (L)             12/16/2021           ?     HCT                      29.8 (L)            12/16/2021           ?     MCV                      70.8 (L)            12/16/2021           ?     PLT                      373                 12/16/2021           ?   ?Anesthesia Other Findings ? ? Reproductive/Obstetrics ?(+) Pregnancy ? ?  ? ? ? ? ? ? ? ? ? ? ? ? ? ?  ?  ? ? ? ? ? ? ? ?Anesthesia Physical ?Anesthesia Plan ? ?ASA: 3 and emergent ? ?Anesthesia Plan: Spinal  ? ?Post-op Pain Management: Regional block* and Minimal or no pain anticipated  ? ?Induction: Intravenous ? ?PONV Risk Score and Plan: 3 and Treatment may vary due to age or medical condition and Ondansetron ? ?Airway Management Planned: Natural Airway ? ?Additional Equipment: None ? ?Intra-op Plan:  ? ?Post-operative Plan:  ? ?Informed Consent: I have reviewed the patients History and Physical, chart,  labs and discussed the procedure including the risks, benefits and alternatives for the proposed anesthesia with the patient or authorized representative  who has indicated his/her understanding and acceptance.  ? ? ? ?Dental advisory given ? ?Plan Discussed with:  ? ?Anesthesia Plan Comments:   ? ? ? ? ? ? ?Anesthesia Quick Evaluation ? ?

## 2021-12-16 NOTE — H&P (Signed)
Maria Mercado is a 31 y.o. female presenting for painful contractions ? ?31 yo G4P2012 @ 40+3 presents with painful contractions to MAU. The patient was seen in the office for her routine prenatal appointment earlier today and found to be breech. She was originally scheduled for an IOL on Wednesday 4/12, however, this was changed today to attempted ECV with proceeding with primary c/s if unsuccessful. Patient is now in MAU with painful contractions. Cervix is unchanged from her exam earlier today. Will plan to admit to L&D and place epidural and attempt ECV, if not successful, then will proceed with cesarean section ? ?Pt was admitted for right flank @ 37 weeks. No definitive cause was identified and she was ultimately discharged home with pain control ? ?OB History   ? ? Gravida  ?4  ? Para  ?2  ? Term  ?2  ? Preterm  ?   ? AB  ?1  ? Living  ?2  ?  ? ? SAB  ?1  ? IAB  ?   ? Ectopic  ?   ? Multiple  ?0  ? Live Births  ?2  ?   ?  ?  ? ?Past Medical History:  ?Diagnosis Date  ? Anxiety   ? Back pain   ? Depression   ? History of kidney stones   ? HS (hereditary spherocytosis) (HCC)   ? ?Past Surgical History:  ?Procedure Laterality Date  ? CYST EXCISION    ? breast and axial   ? WISDOM TOOTH EXTRACTION    ? ?Family History: family history includes Heart attack in her maternal grandfather; Hypertension in her father and mother; Stroke in her maternal grandfather. ?Social History:  reports that she has never smoked. She has never used smokeless tobacco. She reports that she does not currently use alcohol. She reports that she does not use drugs. ? ? ?  ?Maternal Diabetes: No ?Genetic Screening: Normal ?Maternal Ultrasounds/Referrals: Normal ?Fetal Ultrasounds or other Referrals:  None ?Maternal Substance Abuse:  No ?Significant Maternal Medications:  None ?Significant Maternal Lab Results:  None ?Other Comments:  None ? ?Review of Systems ?History ?Dilation: 2.5 ?Effacement (%): Thick ?Station: Ballotable ?Exam by::  weston,rn ?Blood pressure 120/74, pulse 90, temperature 98.5 ?F (36.9 ?C), temperature source Oral, resp. rate 16, height 5\' 5"  (1.651 m), weight 86.5 kg, last menstrual period 03/08/2021, SpO2 100 %, unknown if currently breastfeeding. ?Exam ?Physical Exam  ?Prenatal labs: ?ABO, Rh: --/--/O POS (03/21 1530) ?Antibody: NEG (03/21 1530) ?Rubella: Immune, Immune (09/28 0000) ?RPR: Nonreactive, Nonreactive (09/28 0000)  ?HBsAg: Negative, Negative (09/28 0000)  ?HIV: Non-reactive, Non-reactive (09/28 0000)  ?GBS: Negative/-- (03/07 0000)  ? ?Assessment/Plan: ?1) Admit ?2) Place epidural ?3) Attempt ECV, if unsuccessful will proceed with cesarean delivery  ? ?07-12-2002 ?12/16/2021, 4:54 PM ? ? ? ? ?

## 2021-12-16 NOTE — MAU Note (Signed)
...  Maria Mercado is a 31 y.o. at [redacted]w[redacted]d here in MAU reporting: CTX since around noon today that are currently 6-8 minutes apart. She states she was told she needed to come to the hospital if she showed signs of any contractions so that she may still get her version for breech presentation. Denies VB or LOF. +FM.  ? ?She states she at a ROB appointment this morning at 0900 and she states she was 2-3 cm. She states that as the doctor was sweeping her membranes, she stopped because she did not think she was feeling a head. Patient states she had "half" of a membrane sweep.  ? ?Pain score:  ?3/10 lower abdomen ? ?FHT: 140 initial ? ? ?

## 2021-12-16 NOTE — Transfer of Care (Signed)
Immediate Anesthesia Transfer of Care Note ? ?Patient: Maria Mercado ? ?Procedure(s) Performed: CESAREAN SECTION ? ?Patient Location: PACU ? ?Anesthesia Type:Spinal ? ?Level of Consciousness: awake ? ?Airway & Oxygen Therapy: Patient Spontanous Breathing ? ?Post-op Assessment: Report given to RN and Post -op Vital signs reviewed and stable ? ?Post vital signs: Reviewed and stable ? ?Last Vitals:  ?Vitals Value Taken Time  ?BP 125/71 12/16/21 2019  ?Temp    ?Pulse 92 12/16/21 2024  ?Resp 19 12/16/21 2024  ?SpO2 100 % 12/16/21 2024  ?Vitals shown include unvalidated device data. ? ?Last Pain:  ?Vitals:  ? 12/16/21 1733  ?TempSrc: Oral  ?PainSc:   ?   ? ?  ? ?Complications: No notable events documented. ?

## 2021-12-16 NOTE — Progress Notes (Signed)
Patient ID: Maria Mercado, female   DOB: [birth year], 31 y.o.   MRN: [id number] ? ?S: Patient feeling more pain with contractions and pressure ?O: AFVSS ?FHR 140 reactive cat 1 tracing ?Cervix 4 cm ?Toco every 2  ? ?Patient progressing in labor, given progress we will not be able to attempt external cephalic version will need to proceed with primary cesarean delivery. ?

## 2021-12-16 NOTE — Anesthesia Procedure Notes (Signed)
Spinal ? ?Patient location during procedure: OB ?Start time: 12/16/2021 6:57 PM ?End time: 12/16/2021 7:01 PM ?Reason for block: surgical anesthesia ?Staffing ?Performed: anesthesiologist  ?Anesthesiologist: Barnet Glasgow, MD ?Preanesthetic Checklist ?Completed: patient identified, IV checked, risks and benefits discussed, surgical consent, monitors and equipment checked, pre-op evaluation and timeout performed ?Spinal Block ?Patient position: sitting ?Prep: DuraPrep and site prepped and draped ?Patient monitoring: heart rate, cardiac monitor, continuous pulse ox and blood pressure ?Approach: midline ?Location: L3-4 ?Injection technique: single-shot ?Needle ?Needle type: Pencan  ?Needle gauge: 24 G ?Needle length: 10 cm ?Needle insertion depth: 6 cm ?Assessment ?Sensory level: T4 ?Events: CSF return ?Additional Notes ? 1 Attempt (s). Pt tolerated procedure well. ? ? ? ?

## 2021-12-16 NOTE — Lactation Note (Addendum)
This note was copied from a baby's chart. ?Lactation Consultation Note ? ?Patient Name: Maria Mercado ?Today's Date: 12/16/2021 ?Reason for consult: Initial assessment;Term ?Age:29 hours ?P3, term female infant. ?Per mom, she feels infant is breastfeeding well most feedings are 15 minutes in length. ?Per mom, she is latching infant on both breast during a feeding. ?LC did not observe latch due infant finished feeding prior to [facility] entering the room, mom was doing skin to skin with sleeping infant. ?See maternal data for mom's previous breastfeeding experience. ?Mom will continue to breastfeed infant according to hunger cues, 8 to 12+ or more times within 24 hours. ?Mom made aware of O/P services, breastfeeding support groups, community resources, and our phone # for post-discharge questions.   ?Maternal Data ?Has patient been taught Hand Expression?: Yes ?Does the patient have breastfeeding experience prior to this delivery?: Yes ?How long did the patient breastfeed?: Per mom 1st child she BF for 4 weeks, 2nd child had lip and tongue tie , re-admitted to hospital for FTT at 7 weeks, she was seen by [facility] outpatient clinic and BF her son for 21 months. ? ?Feeding ?Mother's Current Feeding Choice: Breast Milk ? ?LATCH Score ? ? ?Lactation Tools Discussed/Used ?  ? ?Interventions ?Interventions: Breast feeding basics reviewed;Skin to skin;Position options;Breast compression;LC Services brochure ? ?Discharge ?  ? ?Consult Status ?Consult Status: Follow-up ?Date: 12/17/21 ?Follow-up type: In-patient ? ? ? ?Danelle Earthly ?12/16/2021, 11:26 PM ? ? ? ?

## 2021-12-16 NOTE — Telephone Encounter (Signed)
Preadmission screen  

## 2021-12-16 NOTE — MAU Provider Note (Signed)
Event Date/Time  ?First Provider Initiated Contact with Patient 12/16/21 1552   ?  ?S: Ms. Maria Mercado is a 31 y.o. [phone] at [redacted]w[redacted]d  who presents to MAU today complaining contractions q 6-8 minutes since noon today. She denies vaginal bleeding. She denies LOF. She reports normal fetal movement.  She was seen in the office this morning and was told baby was in breech position.  She is planning ECV and IOL in 2 days.  Patient states she was told a version could be attempted if she was in early labor. ? ?O: BP 120/74   Pulse 90   Temp 98.5 ?F (36.9 ?C) (Oral)   Resp 16   Ht 5\' 5"  (1.651 m)   Wt 86.5 kg   LMP 03/08/2021   SpO2 100%   BMI 31.75 kg/m?  ?GENERAL: Well-developed, well-nourished female in no acute distress.  ?HEAD: Normocephalic, atraumatic.  ?CHEST: Normal effort of breathing, regular heart rate ?ABDOMEN: Soft, nontender, gravid ? ?Cervical exam:  ?Dilation: 2 ?Effacement (%): Thick ?Cervical Position: Posterior ?Station: Ballotable ?Presentation: Undeterminable ?Exam by:: weston,rn ?Breech per RN  ? ?Fetal Monitoring: ?Baseline: 150 ?Variability: mod ?Accelerations: + ?Decelerations: no ?Contractions: q6-8 ? ?MDM: Minimal cervical change on recheck, ctx every 4 to 8 minutes, suspect early labor.  Consult with Dr. [phone], plan to admit for possible ECV or C-section.  Patient updated with plan. ? ?A: ?[facility] at [redacted]w[redacted]d  ?Early labor ?Malpresentation ?NST reactive ? ?P: ?Admit to L&D  ?NPO ?Management per Dr. [redacted]w[redacted]d ? ?Tenny Craw, CNM ?12/16/2021 3:56 PM ? ?

## 2021-12-16 NOTE — Anesthesia Postprocedure Evaluation (Signed)
Anesthesia Post Note ? ?Patient: Maria Mercado ? ?Procedure(s) Performed: CESAREAN SECTION ? ?  ? ?Patient location during evaluation: Mother Baby ?Anesthesia Type: Spinal ?Level of consciousness: oriented and awake and alert ?Pain management: pain level controlled ?Vital Signs Assessment: post-procedure vital signs reviewed and stable ?Respiratory status: spontaneous breathing and respiratory function stable ?Cardiovascular status: blood pressure returned to baseline and stable ?Postop Assessment: no headache, no backache, no apparent nausea or vomiting and able to ambulate ?Anesthetic complications: no ? ? ?No notable events documented. ? ?Last Vitals:  ?Vitals:  ? 12/16/21 2115 12/16/21 2122  ?BP: 133/64   ?Pulse: 94 100  ?Resp: (!) 24 (!) 25  ?Temp:  36.9 ?C  ?SpO2: 97%   ?  ?Last Pain:  ?Vitals:  ? 12/16/21 2020  ?TempSrc: Oral  ?PainSc: 0-No pain  ? ?Pain Goal: Patients Stated Pain Goal: 0 (12/16/21 2115) ? ?LLE Motor Response: Purposeful movement (12/16/21 2115) ?LLE Sensation: Tingling (12/16/21 2115) ?RLE Motor Response: Purposeful movement (12/16/21 2115) ?RLE Sensation: Tingling (12/16/21 2115) ?L Sensory Level: L4-Anterior knee, lower leg (12/16/21 2115) ?R Sensory Level: L4-Anterior knee, lower leg (12/16/21 2115) ?Epidural/Spinal Function Cutaneous sensation: Tingles (12/16/21 2115), Patient able to flex knees: Yes (12/16/21 2115), Patient able to lift hips off bed: No (12/16/21 2115), Back pain beyond tenderness at insertion site: No (12/16/21 2115), Progressively worsening motor and/or sensory loss: No (12/16/21 2115), Bowel and/or bladder incontinence post epidural: No (12/16/21 2115) ? ?Maria Mercado ? ? ? ? ?

## 2021-12-17 ENCOUNTER — Encounter (HOSPITAL_COMMUNITY): Payer: Self-pay | Admitting: Obstetrics and Gynecology

## 2021-12-17 LAB — CBC
HCT: 24.2 % — ABNORMAL LOW (ref 36.0–46.0)
Hemoglobin: 6.8 g/dL — CL (ref 12.0–15.0)
MCH: 20.2 pg — ABNORMAL LOW (ref 26.0–34.0)
MCHC: 28.1 g/dL — ABNORMAL LOW (ref 30.0–36.0)
MCV: 71.8 fL — ABNORMAL LOW (ref 80.0–100.0)
Platelets: 314 10*3/uL (ref 150–400)
RBC: 3.37 MIL/uL — ABNORMAL LOW (ref 3.87–5.11)
RDW: 18.4 % — ABNORMAL HIGH (ref 11.5–15.5)
WBC: 15.9 10*3/uL — ABNORMAL HIGH (ref 4.0–10.5)
nRBC: 0.2 % (ref 0.0–0.2)

## 2021-12-17 LAB — RPR: RPR Ser Ql: NONREACTIVE

## 2021-12-17 MED ORDER — FERROUS GLUCONATE 324 (38 FE) MG PO TABS
324.0000 mg | ORAL_TABLET | Freq: Three times a day (TID) | ORAL | Status: DC
  Administered 2021-12-17 – 2021-12-19 (×5): 324 mg via ORAL
  Filled 2021-12-17 (×7): qty 1

## 2021-12-17 NOTE — Progress Notes (Signed)
Patient is eating, ambulating, not yet voided.  Pain control is good.  Appropriate lochia, no complaints.  Had brief episode of slight dizzyness and mild nausea when up to bathroom, resolved spontaneously, feeling overall well. ? ?Vitals:  ? 12/17/21 0053 12/17/21 0255 12/17/21 0502 12/17/21 0737  ?BP:  111/78  115/66  ?Pulse:  92  91  ?Resp: 18 18 16 18   ?Temp:  97.9 ?F (36.6 ?C) 97.9 ?F (36.6 ?C) 98.3 ?F (36.8 ?C)  ?TempSrc:  Oral Oral Oral  ?SpO2: 99% 100% 100% 100%  ?Weight:      ?Height:      ? ? ?Fundus firm ?Inc: c/d/I ?Ext: no calf tenderness ? ?Lab Results  ?Component Value Date  ? WBC 15.9 (H) 12/17/2021  ? HGB 6.8 (LL) 12/17/2021  ? HCT 24.2 (L) 12/17/2021  ? MCV 71.8 (L) 12/17/2021  ? PLT 314 12/17/2021  ? ? ?--/--/O POS (04/10 1717) ? ?A/P Post op day #1 s/p C/S for breech ?Acute blood loss anemia: pt currently asymptomatic, will add iron tid, vitals normal and stable. ? Routine care.   ? ?09-26-1997 ?  ?

## 2021-12-17 NOTE — Progress Notes (Signed)
CRITICAL VALUE STICKER ? ?CRITICAL VALUE: hemoglobin 6.8 ? ?RECEIVER (on-site recipient of call): ? ?DATE & TIME NOTIFIED: 12/17/21 0525 ? ?MESSENGER (representative from lab): ? ?MD NOTIFIED: Dr. Tenny Craw ? ?TIME OF NOTIFICATION: 0535 ? ?RESPONSE: No new orders. ?

## 2021-12-17 NOTE — Social Work (Signed)
MOB was referred for history of depression/anxiety. ? ?* Referral screened out by Clinical Social Worker because none of the following criteria appear to apply: ?~ History of anxiety/depression during this pregnancy, or of post-partum depression following prior delivery. ?~ Diagnosis of anxiety and/or depression within last 3 years ?OR ?* MOB's symptoms currently being treated with medication and/or therapy. Per chart review, MOB onset for depression and anxiety age 31. MOB currently takes Prozac to treat symptoms.  ? ?Please contact the Clinical Social Worker if needs arise, by [facility] request, or if MOB scores greater than 9/yes to question 10 on Edinburgh Postpartum Depression Screen.  ? ?Vivi Barrack, MSW, LCSW ?[facility]  ?Clinical Social Worker  ?[phone] ?12/17/2021  9:11 AM  ?

## 2021-12-17 NOTE — Lactation Note (Signed)
This note was copied from a baby's chart. ?Lactation Consultation Note ? ?Patient Name: Maria Mercado ?Today's Date: 12/17/2021 ?Reason for consult: Follow-up assessment;Mother's request;Difficult latch;Term;Breastfeeding assistance ?Age:29 hours ? ?Mom called for latch assistance infant short feedings 5 to 6 min. Infant adequate urine and stool.  ?Mom denied pain with the latch only compression stripe noted.  ? ?Goulding assisted with latching infant at the breast with signs of milk transfer. Infant fed 3 ml and latched for 15 min.  ? ?Plan 1. Feed based on cues 8-12x 24hr period.  ?2. Mom to latch on breasts and look for signs of milk transfer.  ?3. Mom to offer colostrum on spoon infant sleepy 5-7 ml and then try a latch.  ? ?All questions noted at the end of the visit.  ? ?Maternal Data ?Has patient been taught Hand Expression?: Yes ? ?Feeding ?Mother's Current Feeding Choice: Breast Milk ? ?LATCH Score ?Latch: Repeated attempts needed to sustain latch, nipple held in mouth throughout feeding, stimulation needed to elicit sucking reflex. ? ?Audible Swallowing: Spontaneous and intermittent ? ?Type of Nipple: Everted at rest and after stimulation ? ?Comfort (Breast/Nipple): Soft / non-tender ? ?Hold (Positioning): Assistance needed to correctly position infant at breast and maintain latch. ? ?LATCH Score: 8 ? ? ?Lactation Tools Discussed/Used ?  ? ?Interventions ?Interventions: Breast feeding basics reviewed;Assisted with latch;Skin to skin;Breast massage;Hand express;Breast compression;Adjust position;Support pillows;Position options;Expressed milk;Hand pump;Education;Infant Driven Feeding Algorithm education ? ?Discharge ?  ? ?Consult Status ?Consult Status: Follow-up ?Date: 12/18/21 ?Follow-up type: In-patient ? ? ? ?Maria Heying  Mercado ?12/17/2021, 1:09 PM ? ? ? ?

## 2021-12-18 ENCOUNTER — Inpatient Hospital Stay (HOSPITAL_COMMUNITY)

## 2021-12-18 ENCOUNTER — Inpatient Hospital Stay (HOSPITAL_COMMUNITY): Admission: AD | Admit: 2021-12-18 | Source: Home / Self Care | Admitting: Obstetrics and Gynecology

## 2021-12-18 ENCOUNTER — Observation Stay (HOSPITAL_COMMUNITY)

## 2021-12-18 NOTE — Progress Notes (Signed)
Subjective: ?Postpartum Day 2: Cesarean Delivery ?Patient reports tolerating PO, + flatus, and no problems voiding.  Ambulating well without dizziness. ? ?Objective: ?Vital signs in last 24 hours: ?Temp:  [97.7 ?F (36.5 ?C)-98.3 ?F (36.8 ?C)] 97.7 ?F (36.5 ?C) (04/11 2131) ?Pulse Rate:  [78-99] 83 (04/12 0520) ?Resp:  [16-18] 18 (04/12 0520) ?BP: (108-116)/(62-75) 108/68 (04/12 0520) ?SpO2:  [100 %] 100 % (04/12 0520) ? ?Physical Exam:  ?General: alert and cooperative ?Lochia: appropriate ?Uterine Fundus: firm ?Incision: C/D/I ? ? ?Recent Labs  ?  12/16/21 ?1730 12/17/21 ?0458  ?HGB 8.4* 6.8*  ?HCT 29.8* 24.2*  ? ? ?Assessment/Plan: ?Status post Cesarean section. Doing well postoperatively.   Pt with no major symptoms from her chronic anemia worsened by acute blood loss, taking oral iron ?C-section was in PM of 12/16/21 so will stay until tomorrow AM ?Continue current care. ? ?Maria Mercado ?12/18/2021, 10:58 AM ? ? ?

## 2021-12-18 NOTE — Lactation Note (Signed)
This note was copied from a baby's chart. ?Lactation Consultation Note ?Mom asked for hand pump d/t baby cluster feeding and she wanted to give some colostrum since it was leaking while BF on opposite breast. ?Praised mom. ?Noted baby may have tight frenulum when crying. Mom stated her son had one and she had planned on taking her to have it checked. ?Milk storage reviewed as well as newborn feeding habits. ? ?Patient Name: Maria Mercado ?Today's Date: 12/18/2021 ?Reason for consult: Mother's request;Term ?Age:29 hours ? ?Maternal Data ?  ? ?Feeding ?  ? ?LATCH Score ?  ? ?  ? ?Type of Nipple: Everted at rest and after stimulation ? ?Comfort (Breast/Nipple): Soft / non-tender ? ?  ? ?  ? ? ?Lactation Tools Discussed/Used ?Tools: Pump ?Breast pump type: Manual ?Pump Education: Setup, frequency, and cleaning;Milk Storage ?Reason for Pumping: mom asked for hand pump/supplementation ?Pumping frequency: prn ? ?Interventions ?Interventions: Hand pump ? ?Discharge ?  ? ?Consult Status ?Consult Status: Follow-up ?Date: 12/18/21 ?Follow-up type: In-patient ? ? ? ?Charyl Dancer ?12/18/2021, 3:32 AM ? ? ? ?

## 2021-12-18 NOTE — Lactation Note (Signed)
This note was copied from a baby's chart. ?Lactation Consultation Note ? ?Patient Name: Maria Mercado ?Today's Date: 12/18/2021 ?Reason for consult: Follow-up assessment;Term;Infant weight loss;Other (Comment) (7 % weight loss) ?Age:28 hours - Bilirubin skin check - at 33 hours 8.7  ?As LC entered the room mom latching baby. LC checked the latch and noted the baby was rolling upper lip under,LC flipped the lip and noted increased swallows and per mom comfortable . Baby fed 13 mins with increased swallows.  ?Mom requested to have flange check due to discomfort . While mom was feeding checked the flange size on the other breast #24 F using the front stroke 1st and the back stroke 2nd . Per mom felt more comfortable than when she was doing the reverse. #27 F given for when the milk comes in.  ?Latch score 9.  ? ?Maternal Data ?Has patient been taught Hand Expression?: Yes ? ?Feeding ?Mother's Current Feeding Choice: Breast Milk ? ?LATCH Score ?Latch: Grasps breast easily, tongue down, lips flanged, rhythmical sucking. ? ?Audible Swallowing: Spontaneous and intermittent ? ?Type of Nipple: Everted at rest and after stimulation ? ?Comfort (Breast/Nipple): Soft / non-tender ? ?Hold (Positioning): Assistance needed to correctly position infant at breast and maintain latch. ? ?LATCH Score: 9 ? ? ?Lactation Tools Discussed/Used ?Tools: Pump ?Breast pump type: Manual ?Pump Education: Milk Storage ? ?Interventions ?Interventions: Breast feeding basics reviewed;Assisted with latch;Skin to skin;Breast massage;Hand express;Breast compression;Adjust position;Support pillows;Hand pump;Education;LC Services brochure ? ?Discharge ?Pump: Manual;Personal;DEBP ?WIC Program: No ? ?Consult Status ?Consult Status: Follow-up ?Date: 12/19/21 ?Follow-up type: In-patient ? ? ? ?[name] ?12/18/2021, 9:23 AM ? ? ? ?

## 2021-12-19 MED ORDER — FERROUS GLUCONATE 324 (38 FE) MG PO TABS: 324.0000 mg | ORAL_TABLET | Freq: Three times a day (TID) | ORAL | 3 refills | Status: AC

## 2021-12-19 MED ORDER — OXYCODONE HCL 5 MG PO TABS: 5.0000 mg | ORAL_TABLET | ORAL | 0 refills | Status: AC | PRN

## 2021-12-19 NOTE — Lactation Note (Signed)
This note was copied from a baby's chart. ?Lactation Consultation Note ? ?Patient Name: Maria Mercado ?Today's Date: 12/19/2021 ?Reason for consult: Follow-up assessment ?Age:29 hours ? ? ?Lactation Follow Up Consult: ? ?Baby "Maria Mercado" recently finished a breast feeding session and was asleep next to mother when I arrived. ? ?Mother had no questions/concerns related to feeding.  She has been voiding/stooling well; mother denies pain with feeding.  She is planning an OP visit with [facility] after discharge for reassurance.  She knows Jasmine December as a Research scientist (medical) that worked with her with her second child and his tongue tie issue. ? ?Family has our OP phone number for any questions after discharge.  Mother informed me that she has lots of family support for the next few weeks at home.  Father present.  Family has been discharged. ? ? ?Maternal Data ?  ? ?Feeding ?  ? ?LATCH Score ?  ? ?  ? ?  ? ?  ? ?  ? ?  ? ? ?Lactation Tools Discussed/Used ?  ? ?Interventions ?  ? ?Discharge ?Discharge Education: Engorgement and breast care ? ?Consult Status ?Consult Status: Complete ?Date: 12/19/21 ?Follow-up type: Call as needed ? ? ? ?Genesis Novosad R Isador Castille ?12/19/2021, 11:22 AM ? ? ? ?

## 2021-12-19 NOTE — Progress Notes (Signed)
?  Patient is eating, ambulating, voiding.  Pain control is good. ? ?Vitals:  ? 12/18/21 0520 12/18/21 1430 12/18/21 1941 12/19/21 0502  ?BP: 108/68 110/68 114/64 107/62  ?Pulse: 83 91 92 79  ?Resp: 18 16 16 18   ?Temp:  97.8 ?F (36.6 ?C) 98.6 ?F (37 ?C) 98.4 ?F (36.9 ?C)  ?TempSrc:  Oral Oral Oral  ?SpO2: 100% 99%    ?Weight:      ?Height:      ? ? ?lungs:   clear to auscultation ?cor:    RRR ?Abdomen:  soft, appropriate tenderness, incisions intact and without erythema or exudate ?ex:    no cords  ? ?Lab Results  ?Component Value Date  ? WBC 15.9 (H) 12/17/2021  ? HGB 6.8 (LL) 12/17/2021  ? HCT 24.2 (L) 12/17/2021  ? MCV 71.8 (L) 12/17/2021  ? PLT 314 12/17/2021  ? ? ?--/--/O POS (04/10 1717)/RI ? ?A/P    Post operative day 3.  Acute anemia from blood loss on top of chronic anemia of pregnancy.  Stable with iron only. ? Routine post op and postpartum care.  Expect d/c today. ? Oxycodone for pain control.   ?

## 2021-12-19 NOTE — Discharge Summary (Signed)
? ?  Postpartum Discharge Summary ? ?Date of Service updated  ? ?   ?Patient Name: Maria Mercado ?DOB: 18-Aug-1991 ?MRN: [id number] ? ?Date of admission: 12/16/2021 ?Delivery date:12/16/2021  ?Delivering provider: Vanessa Kick  ?Date of discharge: 12/19/2021 ? ?Admitting diagnosis: Indication for care in labor and delivery, antepartum [O75.9] ?Frank breech presentation, fetus 1 [O32.1XX1] ?Cesarean delivery delivered [O82] ?Intrauterine pregnancy: [redacted]w[redacted]d    ?Secondary diagnosis:  Principal Problem: ?  Indication for care in labor and delivery, antepartum ?Active Problems: ?  FPilar Platebreech presentation, fetus 1 ?  Cesarean delivery delivered ? ?Additional problems: early labor    ?Discharge diagnosis: Term Pregnancy Delivered                                              ?Post partum procedures: none ?Augmentation: N/A ?Complications: None ? ?Hospital course: Onset of Labor With Unplanned C/S   ?31y.o. yo [id number] 465w3das admitted in active labor on 12/16/2021. Patient had a labor course significant for breech and unable to do version. The patient went for cesarean section due to Malpresentation. Delivery details as follows: ?Membrane Rupture Time/Date: 6:26 PM ,12/16/2021   ?Delivery Method:C-Section, Low Transverse  ?Details of operation can be found in separate operative note. Patient had an uncomplicated postpartum course.  She is ambulating,tolerating a regular diet, passing flatus, and urinating well.  Patient is discharged home in stable condition 12/19/21. ? ?Newborn Data: ?Birth date:12/16/2021  ?Birth time:7:24 PM  ?Gender:Female  ?Living status:Living  ?Apgars:7 ,9  ?[id number]  ? ?Magnesium Sulfate received: No ?BMZ received: No ?Rhophylac:No ?MMR:No ?T-DaP:Given prenatally ?Flu: No ?Transfusion:No ? ?Physical exam  ?Vitals:  ? 12/18/21 0520 12/18/21 1430 12/18/21 1941 12/19/21 0502  ?BP: 108/68 110/68 114/64 107/62  ?Pulse: 83 91 92 79  ?Resp: '18 16 16 18  ' ?Temp:  97.8 ?F (36.6 ?C) 98.6 ?F (37 ?C) 98.4  ?F (36.9 ?C)  ?TempSrc:  Oral Oral Oral  ?SpO2: 100% 99%    ?Weight:      ?Height:      ? ? ?Labs: ?Lab Results  ?Component Value Date  ? WBC 15.9 (H) 12/17/2021  ? HGB 6.8 (LL) 12/17/2021  ? HCT 24.2 (L) 12/17/2021  ? MCV 71.8 (L) 12/17/2021  ? PLT 314 12/17/2021  ? ? ?  Latest Ref Rng & Units 11/26/2021  ?  3:30 PM  ?CMP  ?Glucose 70 - 99 mg/dL 86    ?BUN 6 - 20 mg/dL 5    ?Creatinine 0.44 - 1.00 mg/dL 0.56    ?Sodium 135 - 145 mmol/L 134    ?Potassium 3.5 - 5.1 mmol/L 3.3    ?Chloride 98 - 111 mmol/L 102    ?CO2 22 - 32 mmol/L 21    ?Calcium 8.9 - 10.3 mg/dL 8.7    ?Total Protein 6.5 - 8.1 g/dL 6.5    ?Total Bilirubin 0.3 - 1.2 mg/dL 1.2    ?Alkaline Phos 38 - 126 U/L 183    ?AST 15 - 41 U/L 19    ?ALT 0 - 44 U/L 13    ? ?Edinburgh Score: ? ?  12/17/2021  ?  7:13 PM  ?Edinburgh Postnatal Depression Scale Screening Tool  ?I have been able to laugh and see the funny side of things. 0  ?I have looked forward with enjoyment to things. 0  ?I have blamed  myself unnecessarily when things went wrong. 2  ?I have been anxious or worried for no good reason. 1  ?I have felt scared or panicky for no good reason. 1  ?Things have been getting on top of me. 2  ?I have been so unhappy that I have had difficulty sleeping. 0  ?I have felt sad or miserable. 0  ?I have been so unhappy that I have been crying. 1  ?The thought of harming myself has occurred to me. 0  ?Edinburgh Postnatal Depression Scale Total 7  ? ? ? ? ?After visit meds:  ?Allergies as of 12/19/2021   ? ?   Reactions  ? Keflex [cephalexin] Itching  ? Has taken Keflex but was okay, except for one generic brand that made her itch.   ? ?  ? ?  ?Medication List  ?  ? ?STOP taking these medications   ? ?promethazine 12.5 MG tablet ?Commonly known as: PHENERGAN ?  ?tamsulosin 0.4 MG Caps capsule ?Commonly known as: FLOMAX ?  ? ?  ? ?TAKE these medications   ? ?albuterol 108 (90 Base) MCG/ACT inhaler ?Commonly known as: VENTOLIN HFA ?Inhale 2 puffs into the lungs every 4  (four) hours as needed for wheezing or shortness of breath. ?  ?ferrous gluconate 324 MG tablet ?Commonly known as: FERGON ?Take 1 tablet (324 mg total) by mouth 3 (three) times daily with meals. ?  ?FLUoxetine 20 MG tablet ?Commonly known as: PROZAC ?Take 20 mg by mouth daily. ?  ?metoCLOPramide 10 MG tablet ?Commonly known as: REGLAN ?Take 1 tablet by mouth 4 (four) times daily. ?  ?ondansetron 8 MG disintegrating tablet ?Commonly known as: ZOFRAN-ODT ?Place 1 tablet inside cheek 3 (three) times daily as needed. ?  ?oxyCODONE 5 MG immediate release tablet ?Commonly known as: Oxy IR/ROXICODONE ?Take 1-2 tablets (5-10 mg total) by mouth every 4 (four) hours as needed for moderate pain. ?What changed:  ?how much to take ?reasons to take this ?  ?pantoprazole 40 MG tablet ?Commonly known as: PROTONIX ?Take 1 tablet by mouth daily. ?  ?polyethylene glycol 17 g packet ?Commonly known as: MIRALAX / GLYCOLAX ?Take 17 g by mouth daily. ?  ?Prenatal 27-1 MG Tabs ?Take 1 tablet by mouth daily. ?  ? ?  ? ?  ?  ? ? ?  ?Discharge Care Instructions  ?(From admission, onward)  ?  ? ? ?  ? ?  Start     Ordered  ? 12/19/21 0000  Discharge wound care:       ?Comments: Sitz baths and icepacks to perineum.  If stitches, they will dissolve.  ? 12/19/21 3818  ? ?  ?  ? ?  ? ? ? ?Discharge home in stable condition ?Infant Feeding:  ? ?Infant Disposition:home with mother ?Discharge instruction: per After Visit Summary and Postpartum booklet. ?Activity: Advance as tolerated. Pelvic rest for 6 weeks.  ?Diet: routine diet ?Anticipated Birth Control: Unsure ?Postpartum Appointment:4 weeks ?Additional Postpartum F/U:  none ?Future Appointments:No future appointments. ?Follow up Visit: ? Follow-up Information   ? ? Paula Compton, MD. Schedule an appointment as soon as possible for a visit in 2 week(s).   ?Specialty: Obstetrics and Gynecology ?Why: Incision check ?Contact information: ?[address] ?[address] ?[place]  [address] ?[phone] ? ? ?  ?  ? ? Vanessa Kick, MD Follow up in 4 week(s).   ?Specialty: Obstetrics and Gynecology ?Contact information: ?[address] ?[address] ?[place] [address] ?[phone] ? ? ?  ?  ? ?  ?  ? ?  ? ? ? ?  ? ?  12/19/2021 ?Daria Pastures, MD ? ? ?

## 2021-12-27 ENCOUNTER — Telehealth (HOSPITAL_COMMUNITY): Payer: Self-pay | Admitting: *Deleted

## 2021-12-27 NOTE — Telephone Encounter (Signed)
Mom reports feeling good. Incision healing well per mom. No concerns regarding herself at this time. EPDS=3 (hospital score=7) ?Mom reports baby is well. Feeding, peeing, and pooping without difficulty. Reviewed safe sleep. Mom has no concerns about baby at present. ? ?Odis Hollingshead, RN 12-27-2021 at 12:41pm ?

## 2023-09-23 IMAGING — CT CT RENAL STONE PROTOCOL
2 of 5 series · 15 of 46 positions shown, 17 images · non-contrast
Comparison: CT May 21, 2018

CLINICAL DATA: Flank pain, renal stone suspected.



[Series 5: thins · axial · 0.91mm/px · z∈[+653,+1092]mm · 12 of 742 slices shown, 14 images]
[im 58/742  soft-tissue]
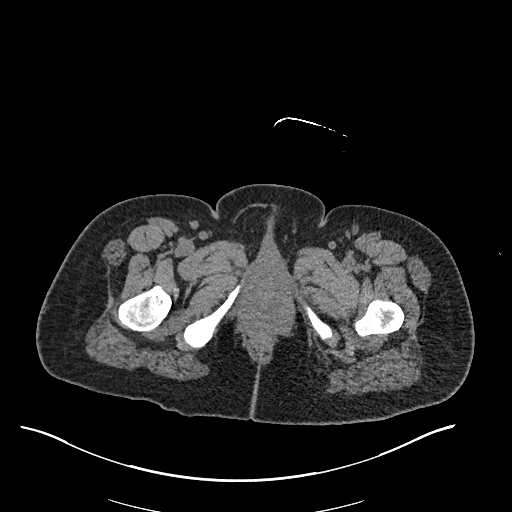
[im 58/742  bone]
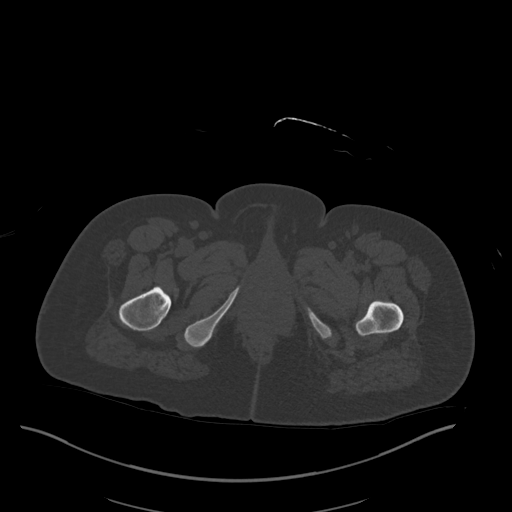
[im 115/742  soft-tissue]
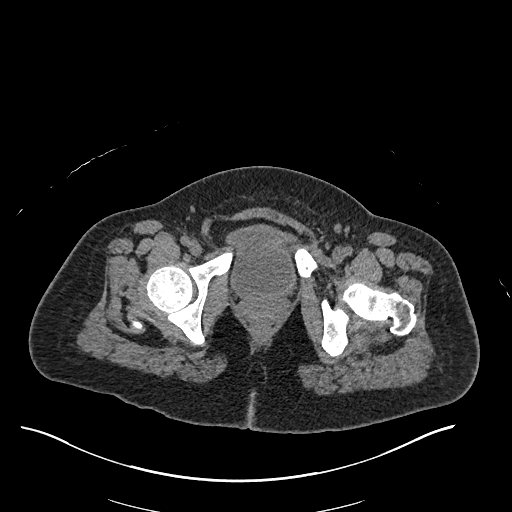
[im 172/742  soft-tissue]
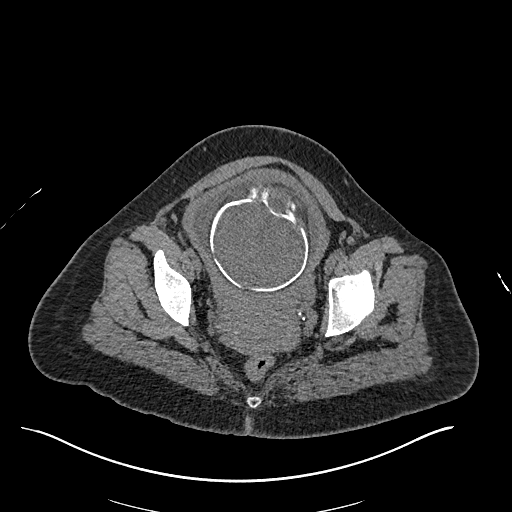
[im 229/742  soft-tissue]
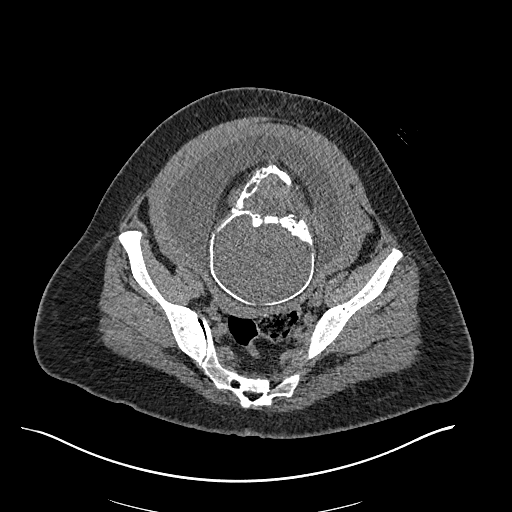
[im 286/742  soft-tissue]
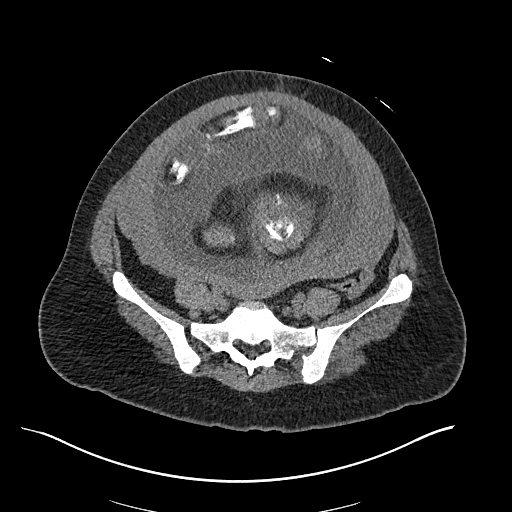
[im 343/742  soft-tissue]
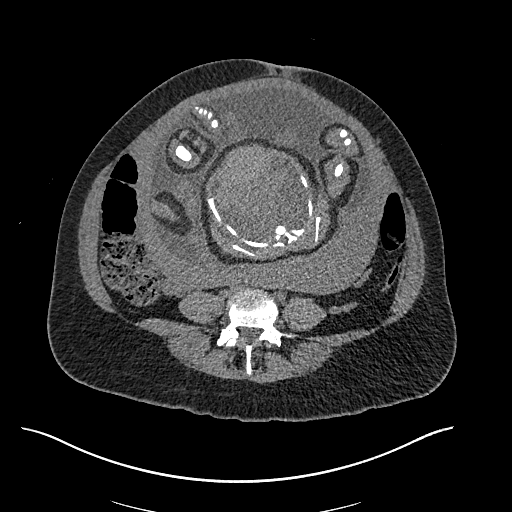
[im 400/742  soft-tissue]
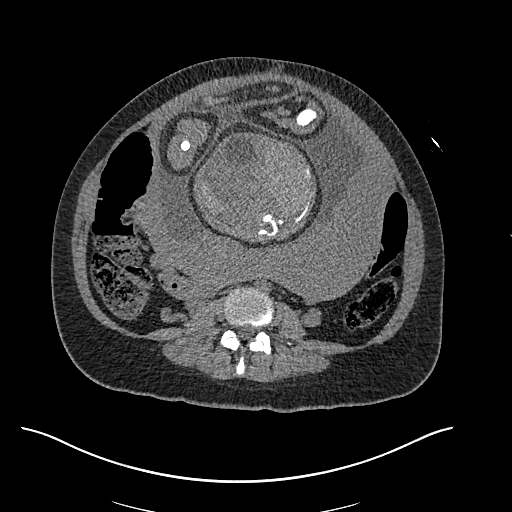
[im 457/742  soft-tissue]
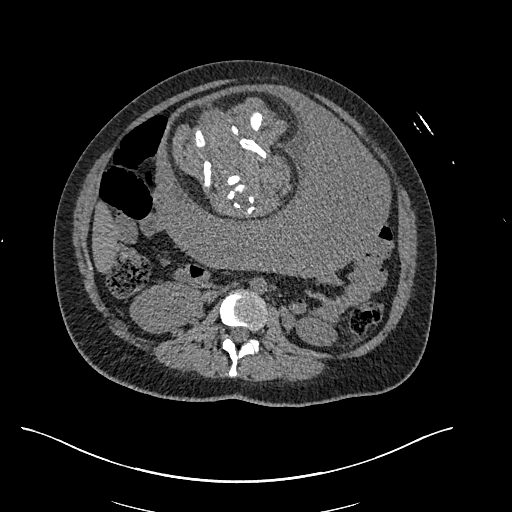
[im 514/742  soft-tissue]
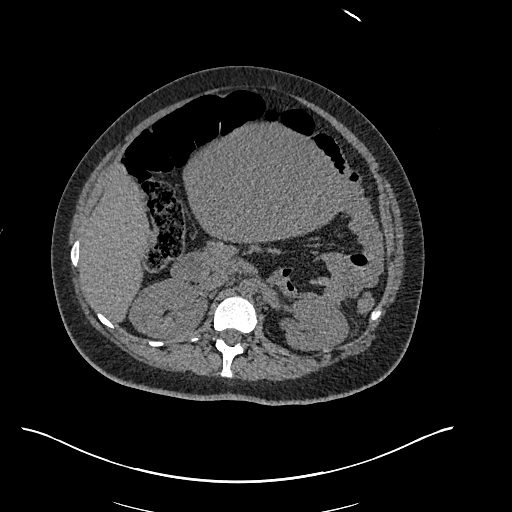
[im 514/742  bone]
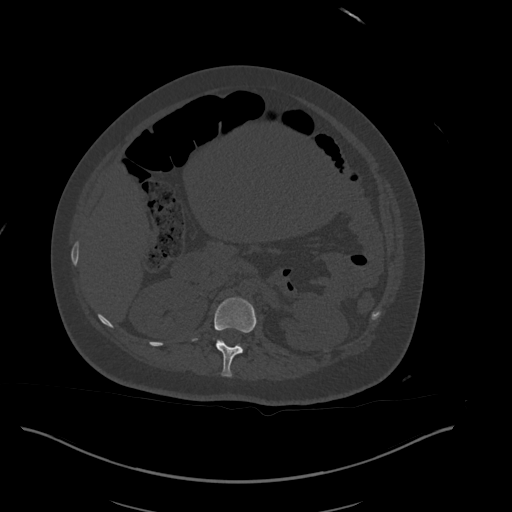
[im 571/742  soft-tissue]
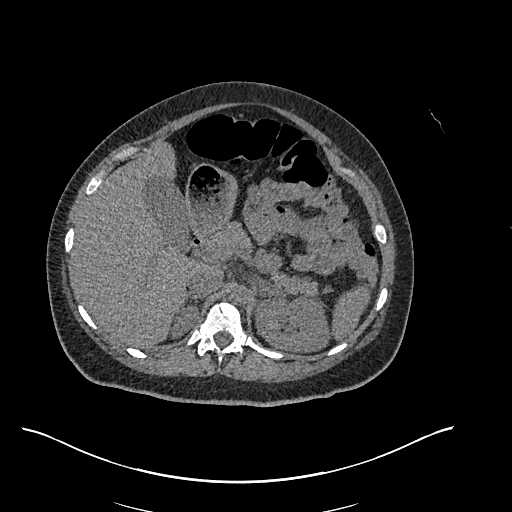
[im 628/742  soft-tissue]
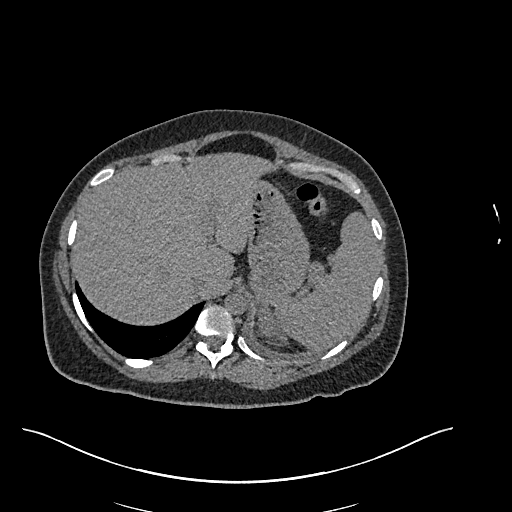
[im 685/742  soft-tissue]
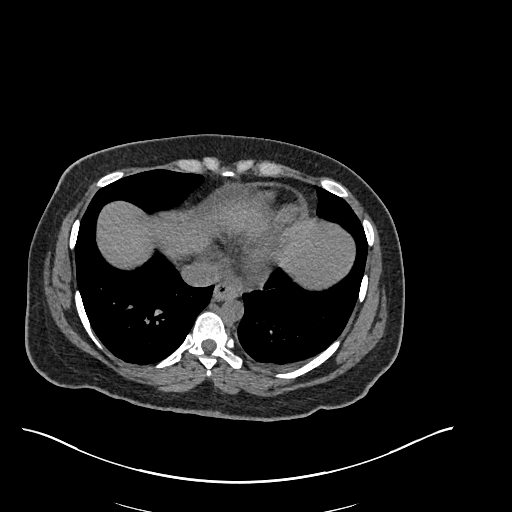

[Series 6: coronal soft tissue · coronal · 0.81mm/px · 3 of 126 slices shown]
[im 42/126  soft-tissue]
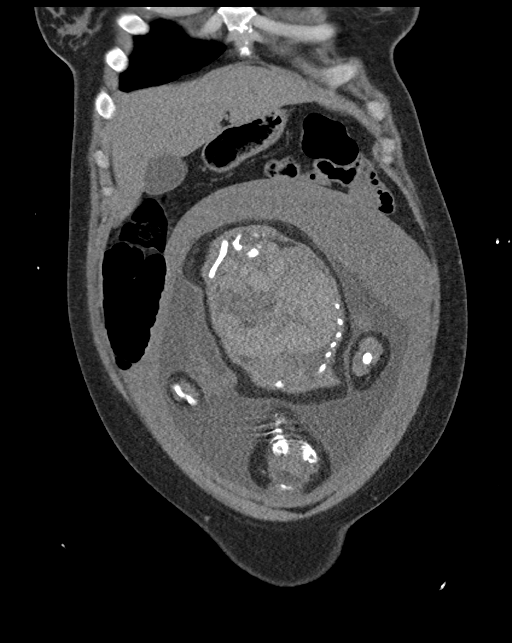
[im 56/126  soft-tissue]
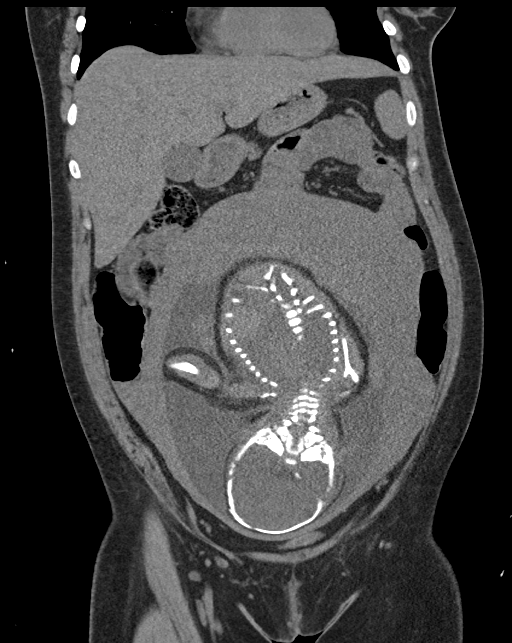
[im 70/126  soft-tissue]
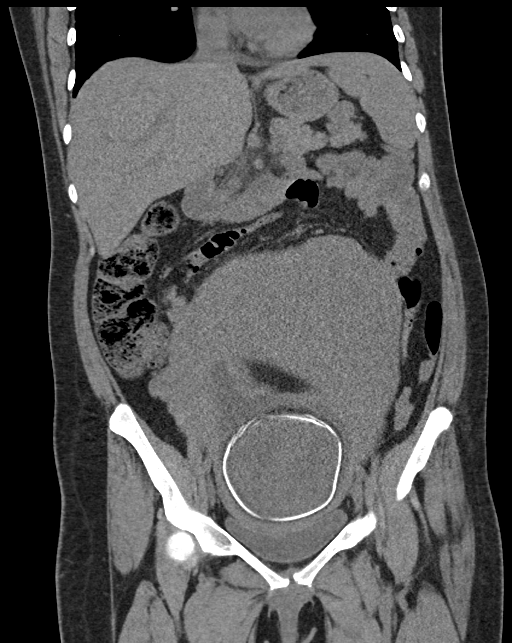

[15 of 46 positions shown; findings below may reference images not displayed]

FINDINGS: Lower chest: No acute abnormality.

Hepatobiliary: No suspicious hepatic lesion on noncontrast
examination. Gallbladder is distended without wall thickening or
pericholecystic fluid identified. No biliary ductal dilation.

Pancreas: No pancreatic ductal dilation or evidence of acute
inflammation.

Spleen: No splenomegaly or focal splenic lesion.

Adrenals/Urinary Tract: Mild thickening of the left adrenal gland
without focal nodularity is favored hyperplasia. Right adrenal gland
appears normal. No hydronephrosis. Punctate nonobstructive right
lower pole renal stone. No obstructive ureteral or bladder calculi
identified. There are few punctate calcifications in the left
hemipelvis along the course of the left ureter, none of which
definitely demonstrate a rim of soft tissue or can be confidently
located within the left ureter.

Stomach/Bowel: No enteric contrast was administered. Stomach is
unremarkable for degree of distension. No pathologic dilation of
small or large bowel. No evidence of acute bowel inflammation.

Vascular/Lymphatic: Normal caliber abdominal aorta. No
pathologically enlarged abdominal or pelvic lymph nodes.

Reproductive: Gravid uterus with baby in cephalad position, not well
evaluated on this study.

Other: Trace pelvic free fluid.

Musculoskeletal: No acute osseous finding.
IMPRESSION: 1. Punctate nonobstructive right lower pole renal stone. No
obstructive ureteral or bladder calculi identified.
2. Several punctate calcifications in the left hemipelvis along the
course of the left ureter, none of which definitely demonstrate a
rim of soft tissue or can be confidently located within the left
ureter. These likely reflect phleboliths however a nonobstructive
ureteral calculus is still a differential consideration.
3. Gravid uterus with baby in cephalad position, not well evaluated
on this study.

## 2023-09-23 IMAGING — US US RENAL
1 series · 15 of 25 positions shown · non-contrast
Comparison: Renal ultrasound 11/23/2021

CLINICAL DATA: Left upper quadrant/flank pain, kidney stone seen on
prior ultrasound

EXAM:
RENAL / URINARY TRACT ULTRASOUND COMPLETE

[Series 1: us renal · 15 of 45 slices shown]
[im 1/45]
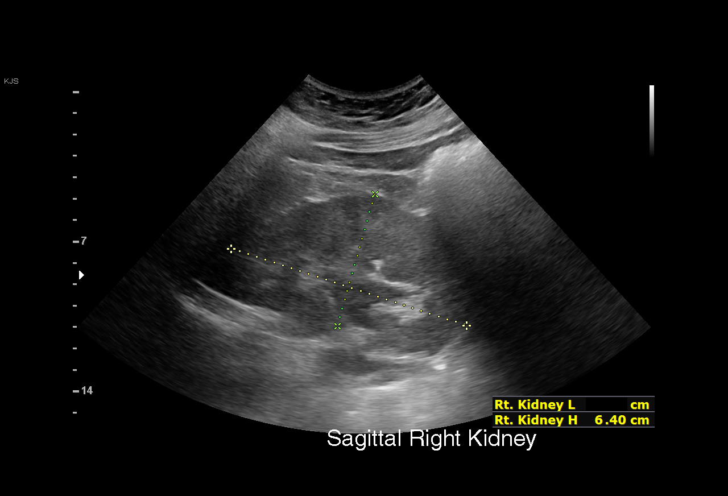
[im 4/45]
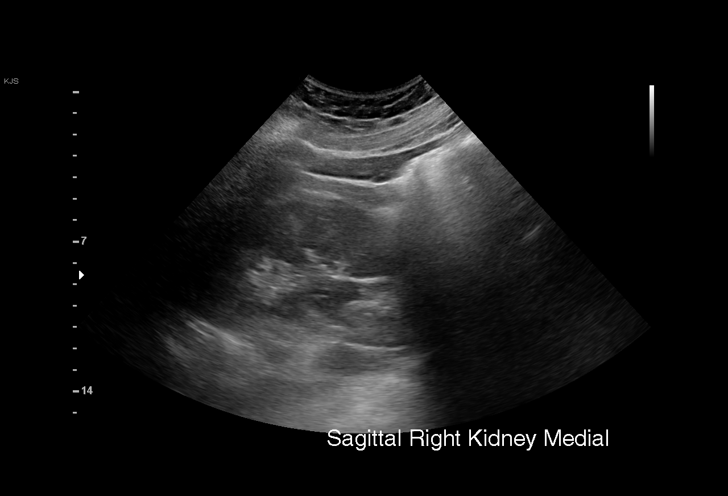
[im 8/45]
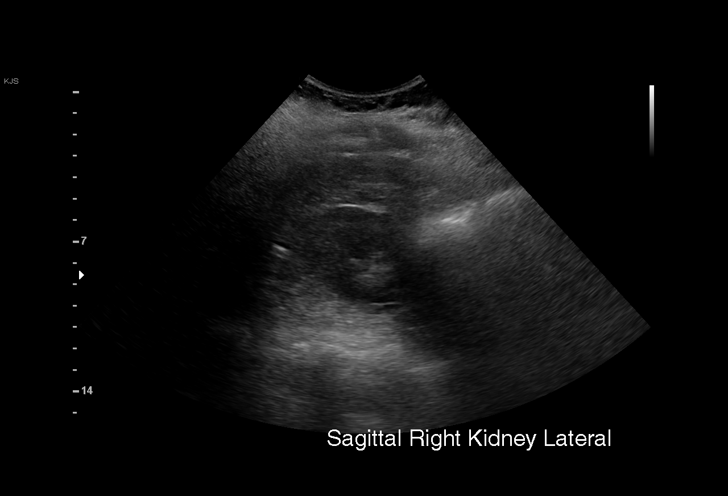
[im 10/45]
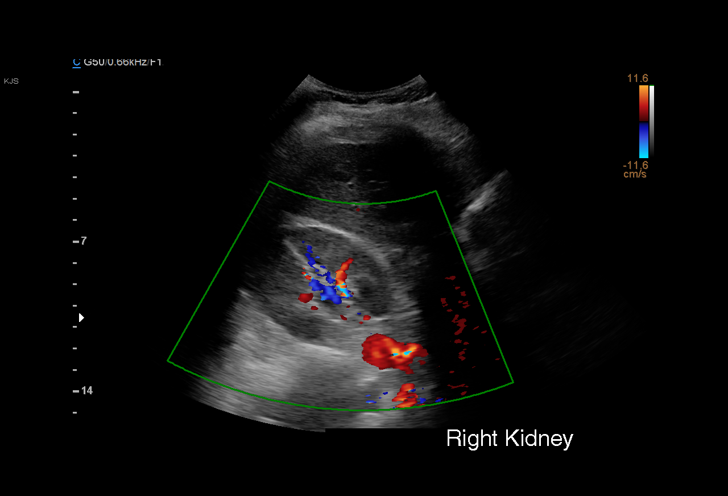
[im 13/45]
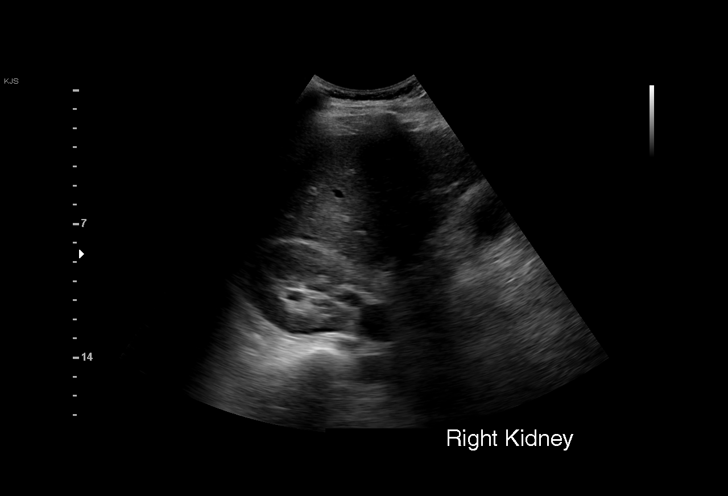
[im 17/45]
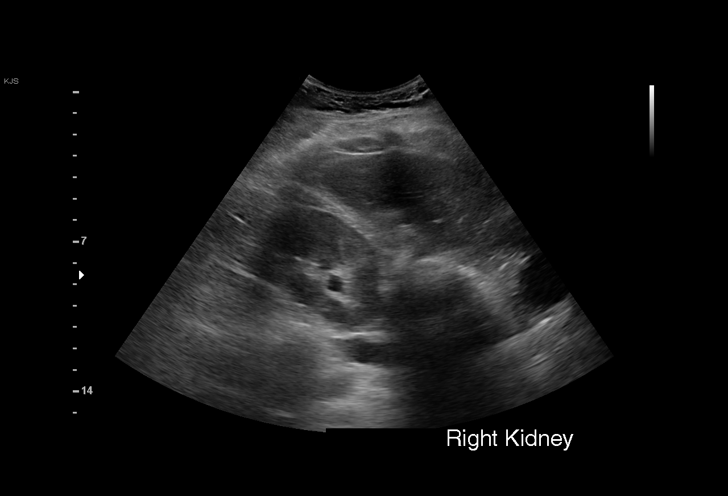
[im 19/45]
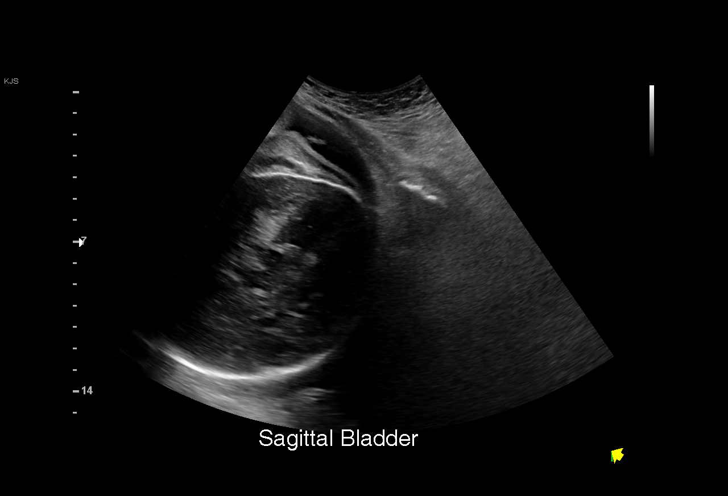
[im 23/45]
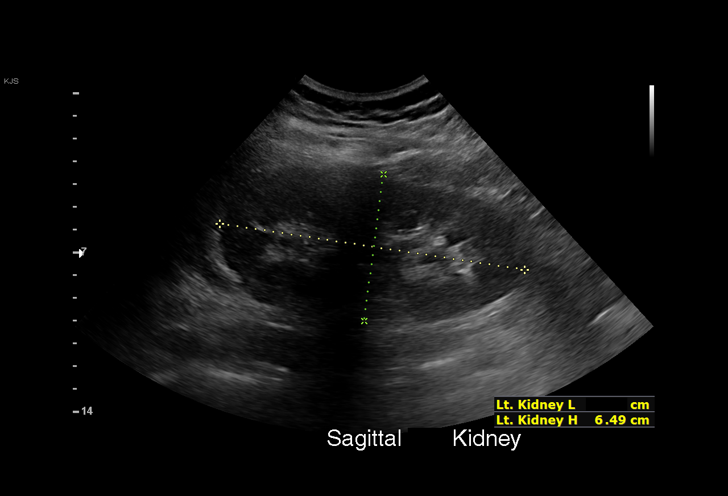
[im 26/45]
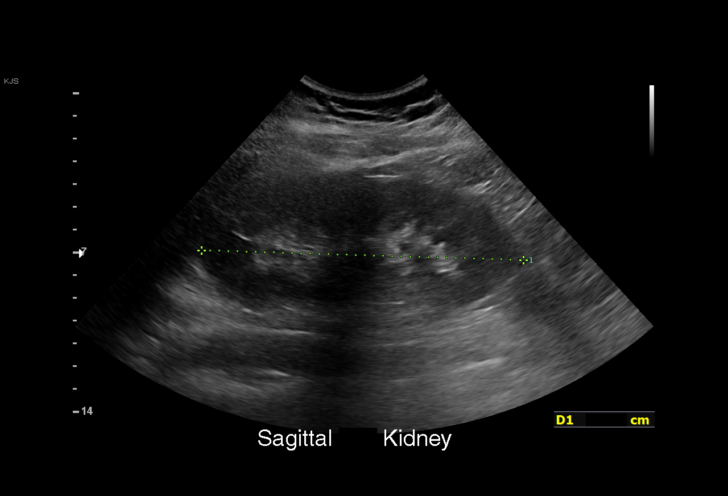
[im 28/45]
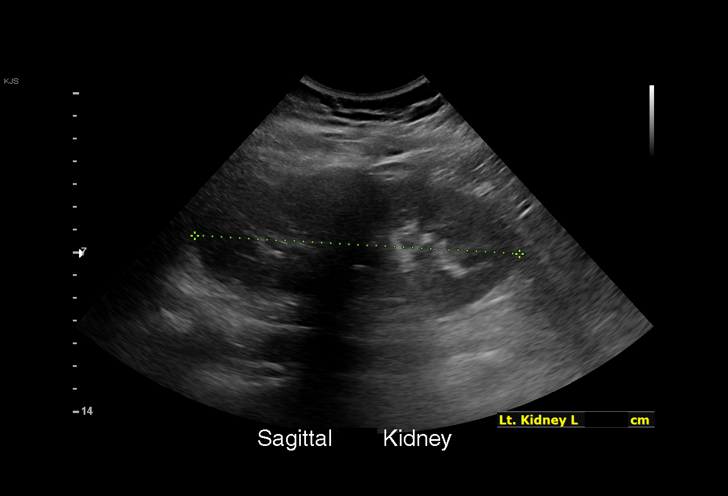
[im 32/45]
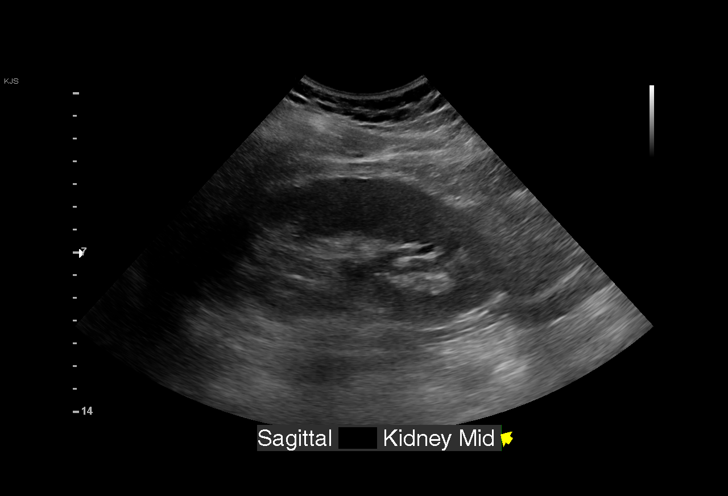
[im 35/45]
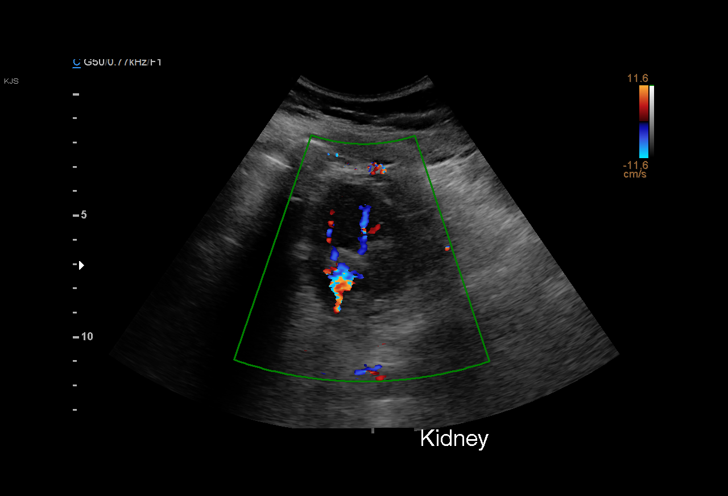
[im 37/45]
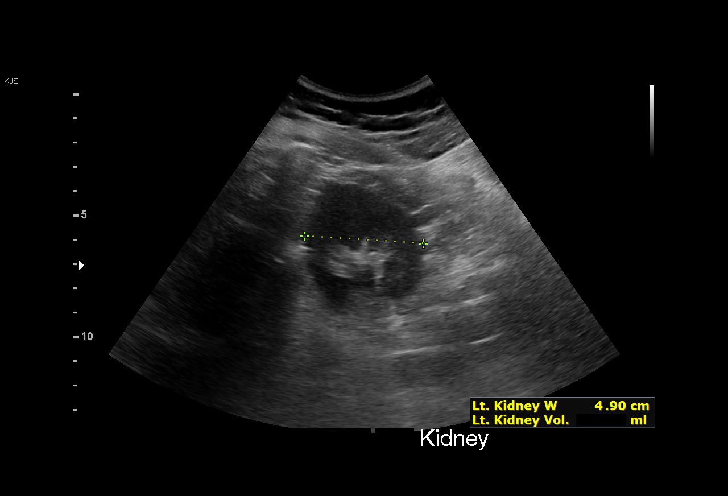
[im 41/45]
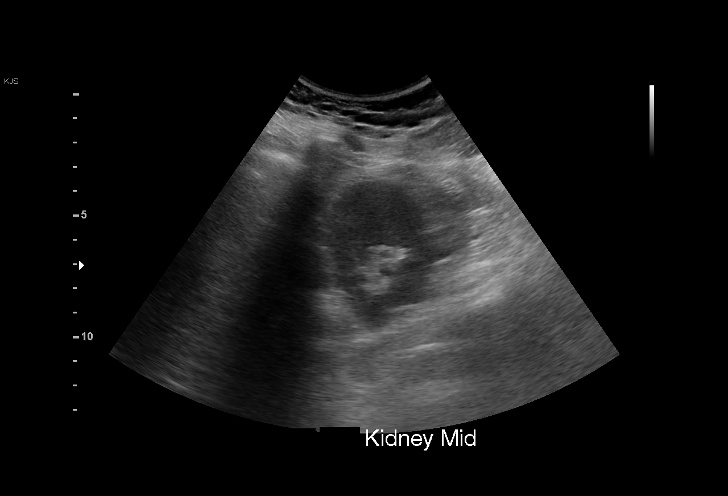
[im 45/45]
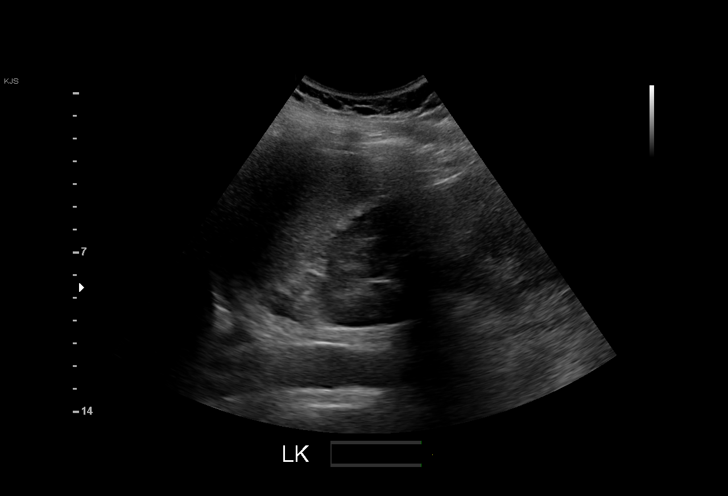

[15 of 25 positions shown; findings below may reference images not displayed]

FINDINGS: Right Kidney:

Renal measurements: 11.6 cm x 6.4 cm x 6.5 cm = volume: 53 mL.
Echogenicity within normal limits. No mass or hydronephrosis
visualized. No shadowing stone is seen.

Left Kidney:

Renal measurements: 14.3 cm x 5.8 cm x 4.9 = volume: 114 mL.
Echogenicity within normal limits. No mass or hydronephrosis
visualized. No shadowing stone is seen.

Bladder:

The bladder is not identified and not well assessed due to the
gravid uterus.

Other:

None.
IMPRESSION: Normal renal ultrasound. The previously described possible stone on
the left is not seen. No hydronephrosis.
